# Patient Record
Sex: Male | Born: 1950 | Race: White | Hispanic: No | Marital: Married | Smoking: Former smoker
Health system: Southern US, Community
[De-identification: ages and names within clinical notes are randomized; demographics above are authoritative.]

## PROBLEM LIST (undated history)

## (undated) DIAGNOSIS — F419 Anxiety disorder, unspecified: Secondary | ICD-10-CM

## (undated) DIAGNOSIS — K760 Fatty (change of) liver, not elsewhere classified: Secondary | ICD-10-CM

## (undated) DIAGNOSIS — F329 Major depressive disorder, single episode, unspecified: Secondary | ICD-10-CM

## (undated) DIAGNOSIS — H9190 Unspecified hearing loss, unspecified ear: Secondary | ICD-10-CM

## (undated) DIAGNOSIS — G20 Parkinson's disease: Secondary | ICD-10-CM

## (undated) DIAGNOSIS — G20A1 Parkinson's disease without dyskinesia, without mention of fluctuations: Secondary | ICD-10-CM

## (undated) DIAGNOSIS — K219 Gastro-esophageal reflux disease without esophagitis: Secondary | ICD-10-CM

## (undated) DIAGNOSIS — E119 Type 2 diabetes mellitus without complications: Secondary | ICD-10-CM

## (undated) DIAGNOSIS — H269 Unspecified cataract: Secondary | ICD-10-CM

## (undated) DIAGNOSIS — R519 Headache, unspecified: Secondary | ICD-10-CM

## (undated) DIAGNOSIS — F32A Depression, unspecified: Secondary | ICD-10-CM

## (undated) DIAGNOSIS — N200 Calculus of kidney: Secondary | ICD-10-CM

## (undated) DIAGNOSIS — R42 Dizziness and giddiness: Secondary | ICD-10-CM

## (undated) DIAGNOSIS — Z974 Presence of external hearing-aid: Secondary | ICD-10-CM

## (undated) DIAGNOSIS — M1990 Unspecified osteoarthritis, unspecified site: Secondary | ICD-10-CM

## (undated) DIAGNOSIS — I10 Essential (primary) hypertension: Secondary | ICD-10-CM

## (undated) HISTORY — DX: Unspecified osteoarthritis, unspecified site: M19.90

## (undated) HISTORY — DX: Essential (primary) hypertension: I10

## (undated) HISTORY — DX: Depression, unspecified: F32.A

## (undated) HISTORY — DX: Type 2 diabetes mellitus without complications: E11.9

## (undated) HISTORY — DX: Calculus of kidney: N20.0

## (undated) HISTORY — DX: Major depressive disorder, single episode, unspecified: F32.9

## (undated) HISTORY — DX: Parkinson's disease without dyskinesia, without mention of fluctuations: G20.A1

## (undated) HISTORY — DX: Parkinson's disease: G20

## (undated) HISTORY — PX: HERNIA REPAIR: SHX51

## (undated) HISTORY — DX: Dizziness and giddiness: R42

## (undated) HISTORY — PX: JOINT REPLACEMENT: SHX530

---

## 2011-04-07 DIAGNOSIS — E349 Endocrine disorder, unspecified: Secondary | ICD-10-CM | POA: Insufficient documentation

## 2011-12-09 HISTORY — PX: KNEE ARTHROSCOPY: SUR90

## 2012-08-31 DIAGNOSIS — H9190 Unspecified hearing loss, unspecified ear: Secondary | ICD-10-CM | POA: Insufficient documentation

## 2013-09-06 DIAGNOSIS — M1990 Unspecified osteoarthritis, unspecified site: Secondary | ICD-10-CM | POA: Insufficient documentation

## 2014-04-13 DIAGNOSIS — D126 Benign neoplasm of colon, unspecified: Secondary | ICD-10-CM | POA: Insufficient documentation

## 2016-08-15 DIAGNOSIS — F411 Generalized anxiety disorder: Secondary | ICD-10-CM | POA: Insufficient documentation

## 2016-11-13 DIAGNOSIS — N200 Calculus of kidney: Secondary | ICD-10-CM | POA: Insufficient documentation

## 2017-02-11 DIAGNOSIS — E119 Type 2 diabetes mellitus without complications: Secondary | ICD-10-CM | POA: Insufficient documentation

## 2017-08-21 DIAGNOSIS — N132 Hydronephrosis with renal and ureteral calculous obstruction: Secondary | ICD-10-CM | POA: Insufficient documentation

## 2017-12-08 HISTORY — PX: SHOULDER ARTHROSCOPY: SHX128

## 2017-12-16 DIAGNOSIS — M19011 Primary osteoarthritis, right shoulder: Secondary | ICD-10-CM | POA: Insufficient documentation

## 2018-04-06 ENCOUNTER — Encounter: Payer: Self-pay | Admitting: Neurology

## 2018-04-13 NOTE — Progress Notes (Signed)
Bill Bowman was seen today in the movement disorders clinic for neurologic consultation at the request of Bill Haber, MD.  The consultation is for the evaluation of PD.  The records that were made available to me were reviewed.  This patient is accompanied in the office by his spouse who supplements the history. Patient first presented to Dr. Amalia Bowman in January, 2019 with complaints of left upper extremity tremor that had begun 2 years prior.  Primary care physician had given him propranolol, but that did not seem to help the tremor.  Dr. 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  An MRI of the brain was also ordered.  MRI of the brain was completed on February 03, 2018.  I do not have the films, only the report.  There is mild to moderate atrophy and mild cerebral small vessel disease.  There was also noted to be left sphenoid sinusitis.  Patient followed back up with Dr. Amalia Bowman about a month after his first visit and the patient did not feel that the levodopa was helping his tremor.  Therefore, medication was increased to carbidopa/levodopa 25/100, 2 tablets 3 times per day.  Patient still did not think that this was beneficial and he was therefore referred here.  Pt states that it has been 20% helpful.  Specific Symptoms:  Tremor: Yes.  , L hand x 2 years and L leg x 1 year Family hx of similar:  No. Voice: no change Sleep: on trazodone - 50 mg - for sleep x few months.  It has been useful  Vivid Dreams:  Yes.    Acting out dreams:  Doesn't sleep with wife; some mumbling per wife Wet Pillows: No. Postural symptoms:  Yes.    Falls?  No. - has had near falls per pt but 1 fall per wife (fell at [place] down stairs) Bradykinesia symptoms: shuffling gait, difficulty getting out of a chair and difficulty regaining balance Loss of smell:  No. Loss of taste:  No. Urinary Incontinence:  No. Difficulty  Swallowing:  No. Handwriting, micrographia: Yes.   Trouble with ADL's:  No.  Trouble buttoning clothing: No. Depression:  Yes.   but better with trazodone Memory changes:  Yes.   (does own meds without trouble) Hallucinations:  No.  visual distortions: No. N/V:  No. Lightheaded:  Yes.    Syncope: No. Diplopia:  No. but does have some intermittent blurry Dyskinesia:  No.  PREVIOUS MEDICATIONS: Sinemet  ALLERGIES:   Allergies  Allergen Reactions  . Clarithromycin Hypertension  . Hydrocodone-Homatropine Nausea And Vomiting  . Liraglutide Nausea And Vomiting    CURRENT MEDICATIONS:  No outpatient encounter medications on file as of 04/15/2018.   No facility-administered encounter medications on file as of 04/15/2018.     PAST MEDICAL HISTORY:   Past Medical History:  Diagnosis Date  . Arthritis   . Depression   . Diabetes (Bill Bowman)   . Hypertension   . Kidney stones   . Parkinson disease ([place])   . Vertigo     PAST SURGICAL HISTORY:   Past Surgical History:  Procedure Laterality Date  . KNEE ARTHROSCOPY Right 2013  . SHOULDER ARTHROSCOPY Left 12/2017    SOCIAL HISTORY:   Social History   Socioeconomic History  . Marital status: Married    Spouse name: Not on file  . Number of children: Not on file  . Years of education:  Not on file  . Highest education level: Not on file  Occupational History  . Not on file  Social Needs  . Financial resource strain: Not on file  . Food insecurity:    Worry: Not on file    Inability: Not on file  . Transportation needs:    Medical: Not on file    Non-medical: Not on file  Tobacco Use  . Smoking status: Former Smoker    Last attempt to quit: 04/15/1978    Years since quitting: 40.0  . Smokeless tobacco: Former Systems developer    Quit date: 04/15/1978  Substance and Sexual Activity  . Alcohol use: Yes    Comment: beer once a month  . Drug use: Never  . Sexual activity: Not on file  Lifestyle  . Physical activity:    Days per week:  Not on file    Minutes per session: Not on file  . Stress: Not on file  Relationships  . Social connections:    Talks on phone: Not on file    Gets together: Not on file    Attends religious service: Not on file    Active member of club or organization: Not on file    Attends meetings of clubs or organizations: Not on file    Relationship status: Not on file  . Intimate partner violence:    Fear of current or ex partner: Not on file    Emotionally abused: Not on file    Physically abused: Not on file    Forced sexual activity: Not on file  Other Topics Concern  . Not on file  Social History Narrative  . Not on file    FAMILY HISTORY:   Family Status  Relation Name Status  . Mother  Deceased  . Father  Deceased  . Sister x2 Alive  . Brother  Alive  . Child x2 Alive    ROS:  A complete 10 system review of systems was obtained and was unremarkable apart from what is mentioned above.  PHYSICAL EXAMINATION:    VITALS:   Vitals:   04/15/18 1342  BP: 112/72  Pulse: 88  SpO2: 97%  Weight: 214 lb (97.1 kg)  Height: 5\' 11"  (1.803 m)    GEN:  The patient appears stated age and is in NAD. HEENT:  Normocephalic, atraumatic.  The mucous membranes are moist. The superficial temporal arteries are without ropiness or tenderness. CV:  RRR Lungs:  CTAB Neck/HEME:  There are no carotid bruits bilaterally.  Neurological examination:  Orientation: The patient is alert and oriented x3. Fund of knowledge is appropriate.  Recent and remote memory are intact.  Attention and concentration are normal.    Able to name objects and repeat phrases. Cranial nerves: There is good facial symmetry. Pupils are equal round and reactive to light bilaterally. Fundoscopic exam reveals clear margins bilaterally. Extraocular muscles are intact. The visual fields are full to confrontational testing. The speech is fluent and clear. Soft palate rises symmetrically and there is no tongue deviation. Hearing  is intact to conversational tone. Sensation: Sensation is intact to light and pinprick throughout (facial, trunk, extremities). Vibration is intact at the bilateral big toe. There is no extinction with double simultaneous stimulation. There is no sensory dermatomal level identified. Motor: Strength is 5/5 in the bilateral upper and lower extremities.   Shoulder shrug is equal and symmetric.  There is no pronator drift. Deep tendon reflexes: Deep tendon reflexes are 2/4 at the bilateral biceps, triceps,  brachioradialis, patella and achilles. Plantar responses are downgoing bilaterally.  Movement examination: Tone: There is normal tone in the bilateral upper extremities.  The tone in the lower extremities is normal.  Abnormal movements: there is LUE resting tremor that increases with distraction Coordination:  There is minimal decremation with RAM's, only seen with  alternation of supination/pronation of the forearm bilaterally.  All other RAMs  including hand opening and closing, finger taps, heel taps and toe taps are normal. Gait and Station: The patient has no difficulty arising out of a deep-seated chair without the use of the hands. The patient's stride length is good with good arm swing and re-emergent tremor on the L.  The patient has a negative pull test.      ASSESSMENT/PLAN:  1.  Parkinsonian tremor  -Discussed today that the patient did not meet [place] brain bank criteria for the diagnosis, but he was on medication, which certainly could have been covering up bradykinesia and/or rigidity.  After quite some discussion, the patient ultimately decided to make appointment in the future and let me examine him off of medication.  -Discussed the concept of levodopa resistant tremor.  Discussed that this is not mean that levodopa does not work.  Discussed that this is the case in 40 to 50% of patients to take levodopa.  Discussed other methods of treating tremor.  Discussed trihexyphenidyl, which was  discussed with him by Dr. Amalia Bowman.  Discussed also that the dopamine agonist may have slightly better efficacy in terms of tremor control.  However, I also told him that I generally do not recommend changing medication just for tremor, as I often will find that the patient has side effects more than benefit.  Interestingly, although the patient originally stated that levodopa did not help much, he also stated that he did not want to go off of it because he found it beneficial.  For now, he will continue on carbidopa/levodopa 25/100, 2 tablets 3 times per day.  -Discussed the extreme importance of exercise.  Discussed studies on this.  Discussed the types of exercise as needed.  He is very resistant to this.  -Surgical intervention for Parkinson's disease was discussed, although I told him he certainly does not need that now.  He asked me many questions about this and I answered them to the best of my ability.  2.  I will plan on seeing the patient back one time, and then he can certainly return to care with Dr. Amalia Bowman since I am quite a distance to travel for the patient. Much greater than 50% of this visit was spent in counseling and coordinating care.  Total face to face time:  60 min  Thank you, Dr. Amalia Bowman, for allowing me to participate in the care of your patient.  Cc:  Ma Rings, MD

## 2018-04-15 ENCOUNTER — Encounter: Payer: Self-pay | Admitting: Neurology

## 2018-04-15 ENCOUNTER — Ambulatory Visit (INDEPENDENT_AMBULATORY_CARE_PROVIDER_SITE_OTHER): Payer: Medicare Other | Admitting: Neurology

## 2018-04-15 VITALS — BP 112/72 | HR 88 | Ht 71.0 in | Wt 214.0 lb

## 2018-04-15 DIAGNOSIS — G20 Parkinson's disease: Secondary | ICD-10-CM | POA: Diagnosis not present

## 2018-08-18 NOTE — Progress Notes (Signed)
Bill Bowman was seen today in the movement disorders clinic for neurologic consultation at the request of [name].  The consultation is for the evaluation of PD.  The records that were made available to me were reviewed.  This patient is accompanied in the office by his spouse who supplements the history. Patient first presented to [name] in January, 2019 with complaints of left upper extremity tremor that had begun 2 years prior.  Primary care physician had given him propranolol, but that did not seem to help the tremor.  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  An MRI of the brain was also ordered.  MRI of the brain was completed on February 03, 2018.  I do not have the films, only the report.  There is mild to moderate atrophy and mild cerebral small vessel disease.  There was also noted to be left sphenoid sinusitis.  Patient followed back up with [name] about a month after his first visit and the patient did not feel that the levodopa was helping his tremor.  Therefore, medication was increased to carbidopa/levodopa 25/100, 2 tablets 3 times per day.  Patient still did not think that this was beneficial and he was therefore referred here.  Pt states that it has been 20% helpful.  08/20/18 update: Patient is seen today in follow-up for tremor. This patient is accompanied in the office by his spouse who supplements the history. I asked the patient to hold his carbidopa/levodopa 25/100 prior to todays visit. He last took medication 4 days ago.  He doesn't note any change, nor does his wife.  He had 2 falls since last visit.  With one, he "threw a bag a corn on the truck and I went with it."  With the other, he was trying to get out of the bathtub at 2:30am and slipped.    "I don't want to be a [place] pig.  I just want you to fix my tremor."  Tremor is interfering with the things he likes to do.  He  finds himself frustrated because he cannot go fishing because he cannot hold the rod.  He also notes when he grabs a plate things will shake off of the plate.  ALLERGIES:   Allergies  Allergen Reactions  . Clarithromycin Hypertension  . Hydrocodone-Homatropine Nausea And Vomiting  . Liraglutide Nausea And Vomiting    CURRENT MEDICATIONS:  Outpatient Encounter Medications as of 08/20/2018  Medication Sig  . allopurinol (ZYLOPRIM) 100 MG tablet Take 100 mg by mouth daily.  Marland Kitchen aspirin 81 MG chewable tablet Chew 81 mg by mouth daily.  . Exenatide ER (BYDUREON BCISE) 2 MG/0.85ML AUIJ once a week.  . Insulin Glargine (TOUJEO SOLOSTAR) 300 UNIT/ML SOPN Inject 56 Units into the skin daily.  Marland Kitchen ketorolac (TORADOL) 10 MG tablet Take 10 mg by mouth every 6 (six) hours as needed.  . meclizine (ANTIVERT) 25 MG tablet Take 25 mg by mouth as needed.  . meloxicam (MOBIC) 15 MG tablet Take 15 mg by mouth as needed.  . metFORMIN (GLUCOPHAGE) 500 MG tablet Take 1,000 mg by mouth 2 (two) times daily.  Marland Kitchen oxyCODONE (OXY IR/ROXICODONE) 5 MG immediate release tablet Take 5 mg by mouth as needed.  Marland Kitchen PARoxetine (PAXIL) 30 MG tablet Take 30 mg by mouth daily.  . ramipril (ALTACE) 10 MG capsule Take 10 mg by mouth daily.  Marland Kitchen  traZODone (DESYREL) 50 MG tablet Take 50 mg by mouth at bedtime.  . carbidopa-levodopa (SINEMET) 25-100 MG tablet Take 2 tablets by mouth 3 (three) times daily.   No facility-administered encounter medications on file as of 08/20/2018.     PAST MEDICAL HISTORY:   Past Medical History:  Diagnosis Date  . Arthritis   . Depression   . Diabetes ([place])   . Hypertension   . Kidney stones   . Parkinson disease ([place])   . Vertigo     PAST SURGICAL HISTORY:   Past Surgical History:  Procedure Laterality Date  . KNEE ARTHROSCOPY Right 2013  . SHOULDER ARTHROSCOPY Left 12/2017    SOCIAL HISTORY:   Social History   Socioeconomic History  . Marital status: Married    Spouse name: Not on  file  . Number of children: Not on file  . Years of education: Not on file  . Highest education level: Not on file  Occupational History  . 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 Financial resource strain: Not on file  . Food insecurity:    Worry: Not on file    Inability: Not on file  . Transportation needs:    Medical: Not on file    Non-medical: Not on file  Tobacco Use  . Smoking status: Former Smoker    Last attempt to quit: 04/15/1978    Years since quitting: 40.3  . Smokeless tobacco: Former Systems developer    Quit date: 04/15/1978  Substance and Sexual Activity  . Alcohol use: Yes    Comment: beer once a month  . Drug use: Never  . Sexual activity: Not on file  Lifestyle  . Physical activity:    Days per week: Not on file    Minutes per session: Not on file  . Stress: Not on file  Relationships  . Social connections:    Talks on phone: Not on file    Gets together: Not on file    Attends religious service: Not on file    Active member of club or organization: Not on file    Attends meetings of clubs or organizations: Not on file    Relationship status: Not on file  . Intimate partner violence:    Fear of current or ex partner: Not on file    Emotionally abused: Not on file    Physically abused: Not on file    Forced sexual activity: Not on file  Other Topics Concern  . Not on file  Social History Narrative  . Not on file    FAMILY HISTORY:   Family Status  Relation Name Status  . Mother  Deceased  . Father  Deceased  . Sister x2 Alive  . Brother  Alive  . Child x2 Alive    ROS:  Review of Systems  Constitutional: Negative.   HENT: Negative.   Eyes: Negative.   Cardiovascular: Negative.   Gastrointestinal: Negative.   Genitourinary: Negative.   Skin: Negative.   Neurological: Positive for tremors.  Endo/Heme/Allergies: Negative.   Psychiatric/Behavioral: Positive for memory loss.     PHYSICAL EXAMINATION:     VITALS:   Vitals:   08/20/18 1311  BP: (!) 142/84  Pulse: 86  SpO2: 93%  Weight: 212 lb (96.2 kg)  Height: 5\' 10"  (1.778 m)    GEN:  The patient appears stated age and is in NAD. HEENT:  Normocephalic, atraumatic.  The mucous membranes are moist.  The superficial temporal arteries are without ropiness or tenderness. CV:  RRR Lungs:  CTAB Neck/HEME:  There are no carotid bruits bilaterally.  Neurological examination:  Orientation: The patient is alert and oriented x3. Cranial nerves: There is good facial symmetry. The speech is fluent and clear. Soft palate rises symmetrically and there is no tongue deviation. Hearing is intact to conversational tone. Sensation: Sensation is intact to light touch throughout Motor: Strength is 5/5 in the bilateral upper and lower extremities.   Shoulder shrug is equal and symmetric.  There is no pronator drift.   Movement examination: Tone: There is mild increased tone in the LUE with distraction Abnormal movements: there is near constant, mod amplitude LUE resting tremor.  There is L foot tremor as well.   Coordination:  There is decremation (mild to mod) with any form of RAMS, including alternating supination and pronation of the forearm, hand opening and closing, finger taps, heel taps and toe taps bilaterally, L more than right Gait and Station: The patient has no difficulty arising out of a deep-seated chair without the use of the hands. The patient's stride length is good with good arm swing and re-emergent tremor on the L.  The patient has a negative pull test.      ASSESSMENT/PLAN:  1.  Tremor predominant Parkinson's disease  -Long discussion with the patient today that he has levodopa resistant tremor.  Discussed exactly what that means.  He is more bradykinetic today off of medication.  He has mild rigidity off of medication.  Discussed that this does not mean he is a levodopa failure, only that it does not work well for tremor in him.   This is true in 50% of patients.  The patient expresses in very clear terms that the tremor is interfering with his life and he wants to do something about that.  He very much wants surgical intervention.  I talked to him about surgical intervention in early disease.  Discussed early STIM study from [place].  This did show benefit to early DBS, but I did tell him that risks of doing early DBS, especially if he had an atypical state (I do not think he does).  Also discussed that generally we do bilateral DBS, but he is symptoms are predominantly on the left side because he is early on in disease.  He therefore would have one-sided surgery and would need to come back in the future to have the other side.  He stated that he was fine with that, but just wanted to have surgery.  I talked to him about the fact that it just is not that easy.  I would like him to try pramipexole first.  After some discussion, he was agreeable.  He will start pramipexole and work up to 0.5 mg 3 times per day.  Risks, benefits, side effects and alternative therapies were discussed.  The opportunity to ask questions was given and they were answered to the best of my ability.  The patient expressed understanding and willingness to follow the outlined treatment protocols.  -He will continue carbidopa/levodopa 25/100, 2 tablets 3 times per day.  -Wife complains about some memory change and discussed with the patient that all patients need to undergo neurocognitive testing prior to DBS surgery.  We will schedule him with Dr. Si Raider  2.  Much greater than 50% of this visit was spent in counseling and coordinating care.  Total face to face time:  40 min  Cc:  Ma Rings,  MD

## 2018-08-20 ENCOUNTER — Encounter: Payer: Self-pay | Admitting: Neurology

## 2018-08-20 ENCOUNTER — Ambulatory Visit (INDEPENDENT_AMBULATORY_CARE_PROVIDER_SITE_OTHER): Payer: Medicare Other | Admitting: Neurology

## 2018-08-20 VITALS — BP 142/84 | HR 86 | Ht 70.0 in | Wt 212.0 lb

## 2018-08-20 DIAGNOSIS — G20 Parkinson's disease: Secondary | ICD-10-CM | POA: Diagnosis not present

## 2018-08-20 DIAGNOSIS — R413 Other amnesia: Secondary | ICD-10-CM | POA: Diagnosis not present

## 2018-08-20 MED ORDER — PRAMIPEXOLE DIHYDROCHLORIDE 0.5 MG PO TABS: 0.5000 mg | ORAL_TABLET | Freq: Three times a day (TID) | ORAL | 5 refills | Status: DC

## 2018-08-20 MED ORDER — PRAMIPEXOLE DIHYDROCHLORIDE 0.125 MG PO TABS: ORAL_TABLET | ORAL | 0 refills | Status: DC

## 2018-08-20 NOTE — Patient Instructions (Signed)
1. Restart Carbidopa Levodopa.  2. Start mirapex (pramipexole) as follows:  0.125 mg - 1 tablet three times per day for a week, then 2 tablets three times per day for a week and then fill the 0.5 mg tablet and take that, 1 pill three times per day  3. You have been referred for a neurocognitive evaluation in our office.   The evaluation has two parts.   . The first part of the evaluation is a clinical interview with the neuropsychologist (Dr. Macarthur Critchley). Please bring someone with you to this appointment if possible, as it is helpful for Dr. Si Raider to hear from both you and another adult who knows you well.   . The second part of the evaluation is testing with Dr. Marcia Brash technician Hinton Dyer). The testing includes a variety of tasks- mostly question-and-answer, some paper-and-pencil. There is nothing you need to do to prepare for this appointment, but having a good night's sleep prior to the testing, and bringing eyeglasses and hearing aids (if you wear them), is advised.   Please note: We have to reserve several hours of the neuropsychologist's time and the psychometrician's time for your evaluation appointment. As such, please note that there is a No-Show fee of $100. If you are unable to attend any of your appointments, please contact our office as soon as possible to reschedule.  4. We have you scheduled for your on/off test on 01/07/19 at 2:30 pm. Please do not take your Parkinson's medications on this date. Take your last dose on 24 hours prior.

## 2018-09-06 ENCOUNTER — Other Ambulatory Visit: Payer: Self-pay | Admitting: Neurology

## 2018-10-20 ENCOUNTER — Telehealth: Payer: Self-pay | Admitting: Neurology

## 2018-10-20 NOTE — Telephone Encounter (Signed)
Dr Bailar is leaving our office to take a job closer to where she lives. We have mailed a letter to patient to let her know we have canceled all appointments with Bailar. Thank you for your understanding °

## 2018-10-28 ENCOUNTER — Telehealth: Payer: Self-pay | Admitting: Neurology

## 2018-10-28 NOTE — Telephone Encounter (Signed)
Called patient's wife. They are okay with referral to [facility] in [place], since this is close to [place]. We will send referral.

## 2018-10-28 NOTE — Telephone Encounter (Signed)
Patient's wife is calling in asking about where they can get another neuro testing referral sent to. They live in [place] and just got the letter about his cancelled Bailar appts. She said [name] was the referring provider. Please call her back at [phone]. Thanks!

## 2018-10-28 NOTE — Telephone Encounter (Signed)
Referral faxed to [facility] to [phone] with confirmation received.

## 2018-11-08 ENCOUNTER — Telehealth: Payer: Self-pay | Admitting: Neurology

## 2018-11-08 NOTE — Telephone Encounter (Signed)
Patient's wife is calling in wanting a referral sent to the [facility] office since the one in [place] does not take her husband's insurance. Please send this in. If you need to call her it's [phone]. Thanks!

## 2018-11-08 NOTE — Telephone Encounter (Signed)
Will send referral to [facility]. I have tried and fax machine is not working. Will document once fax goes through to their clinic.

## 2018-11-11 NOTE — Telephone Encounter (Signed)
Referral faxed and confirmation from office that it was received.

## 2018-11-12 NOTE — Telephone Encounter (Signed)
Patient is scheduled with Dr. Darol Destine for clinical interview: 12/21/17 at 11:00 am, Neuropsychological testing 12/21/17 at 1:00 pm, and feedback results on 01/04/18 at 1:00 pm.

## 2019-01-07 ENCOUNTER — Ambulatory Visit: Payer: Medicare Other | Admitting: Neurology

## 2019-01-11 NOTE — Progress Notes (Signed)
Bill Bowman was seen today in the movement disorders clinic for neurologic consultation at the request of [name].  The consultation is for the evaluation of PD.  The records that were made available to me were reviewed.  This patient is accompanied in the office by his spouse who supplements the history. Patient first presented to [name] in January, 2019 with complaints of left upper extremity tremor that had begun 2 years prior.  Primary care physician had given him propranolol, but that did not seem to help the tremor.  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  An MRI of the brain was also ordered.  MRI of the brain was completed on February 03, 2018.  I do not have the films, only the report.  There is mild to moderate atrophy and mild cerebral small vessel disease.  There was also noted to be left sphenoid sinusitis.  Patient followed back up with [name] about a month after his first visit and the patient did not feel that the levodopa was helping his tremor.  Therefore, medication was increased to carbidopa/levodopa 25/100, 2 tablets 3 times per day.  Patient still did not think that this was beneficial and he was therefore referred here.  Pt states that it has been 20% helpful.  08/20/18 update: Patient is seen today in follow-up for tremor. This patient is accompanied in the office by his spouse who supplements the history. I asked the patient to hold his carbidopa/levodopa 25/100 prior to todays visit. He last took medication 4 days ago.  He doesn't note any change, nor does his wife.  He had 2 falls since last visit.  With one, he "threw a bag a corn on the truck and I went with it."  With the other, he was trying to get out of the bathtub at 2:30am and slipped.    "I don't want to be a [place] pig.  I just want you to fix my tremor."  Tremor is interfering with the things he likes to do.  He  finds himself frustrated because he cannot go fishing because he cannot hold the rod.  He also notes when he grabs a plate things will shake off of the plate.  01/12/19 update: Patient is seen today in follow-up for levodopa challenge, accompanied by his wife who supplements the history.  I started the patient on pramipexole last visit, 0.5 mg 3 times per day.  He has had no compulsive behaviors.  No sleep attacks.  He is not sure that the pramipexole helped the tremor.  He has had a few falls since last visit.  Some of those were when turning.  He is still on carbidopa/levodopa 25/100, 2 tablets 3 times per day.  Patient had neurocognitive testing done on December 21, 2018 at [facility] neuropsychology with Dr. Darol Destine.  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  It was felt that depression was not functionally interfering, but that should have become so, he could benefit from supportive psychotherapy.  He is already on Paxil.  ALLERGIES:   Allergies  Allergen Reactions  . Clarithromycin Hypertension  . Hydrocodone-Homatropine Nausea And Vomiting  . Liraglutide Nausea And Vomiting    CURRENT MEDICATIONS:  Outpatient Encounter Medications as of 01/12/2019  Medication Sig  . allopurinol (ZYLOPRIM) 100 MG tablet Take 100 mg by mouth daily.  . carbidopa-levodopa (SINEMET) 25-100 MG  tablet Take 2 tablets by mouth 3 (three) times daily.  . Exenatide ER (BYDUREON BCISE) 2 MG/0.85ML AUIJ once a week.  Marland Kitchen ketorolac (TORADOL) 10 MG tablet Take 10 mg by mouth every 6 (six) hours as needed.  . meclizine (ANTIVERT) 25 MG tablet Take 25 mg by mouth as needed.  . meloxicam (MOBIC) 15 MG tablet Take 15 mg by mouth as needed.  . metFORMIN (GLUCOPHAGE) 500 MG tablet Take 1,000 mg by mouth 2 (two) times daily.  Marland Kitchen oxyCODONE (OXY IR/ROXICODONE) 5 MG immediate release tablet Take 5 mg by mouth as needed.  Marland Kitchen PARoxetine (PAXIL) 30 MG tablet Take 30 mg by mouth daily.  .  pramipexole (MIRAPEX) 0.5 MG tablet Take 1 tablet (0.5 mg total) by mouth 3 (three) times daily.  . ramipril (ALTACE) 10 MG capsule Take 10 mg by mouth daily.  . traZODone (DESYREL) 50 MG tablet Take 50 mg by mouth at bedtime.  . Insulin Glargine (TOUJEO SOLOSTAR) 300 UNIT/ML SOPN Inject 56 Units into the skin daily.  . [DISCONTINUED] pramipexole (MIRAPEX) 0.5 MG tablet 0.5 mg three x a day   No facility-administered encounter medications on file as of 01/12/2019.     PAST MEDICAL HISTORY:   Past Medical History:  Diagnosis Date  . Arthritis   . Depression   . Diabetes ([place])   . Hypertension   . Kidney stones   . Parkinson disease ([place])   . Vertigo     PAST SURGICAL HISTORY:   Past Surgical History:  Procedure Laterality Date  . KNEE ARTHROSCOPY Right 2013  . SHOULDER ARTHROSCOPY Left 12/2017    SOCIAL HISTORY:   Social History   Socioeconomic History  . Marital status: Married    Spouse name: Not on file  . Number of children: Not on file  . Years of education: Not on file  . Highest education level: Not on file  Occupational History  . 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 Financial resource strain: Not on file  . Food insecurity:    Worry: Not on file    Inability: Not on file  . Transportation needs:    Medical: Not on file    Non-medical: Not on file  Tobacco Use  . Smoking status: Former Smoker    Last attempt to quit: 04/15/1978    Years since quitting: 40.7  . Smokeless tobacco: Former Systems developer    Quit date: 04/15/1978  Substance and Sexual Activity  . Alcohol use: Yes    Comment: beer once a month  . Drug use: Never  . Sexual activity: Not on file  Lifestyle  . Physical activity:    Days per week: Not on file    Minutes per session: Not on file  . Stress: Not on file  Relationships  . Social connections:    Talks on phone: Not on file    Gets together: Not on file    Attends religious service: Not on file      Active member of club or organization: Not on file    Attends meetings of clubs or organizations: Not on file    Relationship status: Not on file  . Intimate partner violence:    Fear of current or ex partner: Not on file    Emotionally abused: Not on file    Physically abused: Not on file    Forced sexual activity: Not on file  Other Topics Concern  . Not  on file  Social History Narrative  . Not on file    FAMILY HISTORY:   Family Status  Relation Name Status  . Mother  Deceased  . Father  Deceased  . Sister x2 Alive  . Brother  Alive  . Child x2 Alive    ROS:  Review of Systems  Constitutional: Negative.   HENT: Negative.   Eyes: Negative.   Respiratory: Negative.   Cardiovascular: Negative.   Musculoskeletal: Positive for falls.  Skin: Negative.   Endo/Heme/Allergies: Negative.      PHYSICAL EXAMINATION:    VITALS:   Vitals:   01/12/19 1004  BP: 112/76  Pulse: 90  SpO2: 93%  Weight: 221 lb (100.2 kg)  Height: 5\' 10"  (1.778 m)    GEN:  The patient appears stated age and is in NAD. HEENT:  Normocephalic, atraumatic.  The mucous membranes are moist. The superficial temporal arteries are without ropiness or tenderness. CV:  RRR Lungs:  CTAB Neck/HEME:  There are no carotid bruits bilaterally.  Neurological examination:  Orientation: The patient is alert and oriented x3. Cranial nerves: There is good facial symmetry. The speech is fluent and clear. Soft palate rises symmetrically and there is no tongue deviation. Hearing is intact to conversational tone. Sensation: Sensation is intact to light touch throughout Motor: Strength is 5/5 in the bilateral upper and lower extremities.   Shoulder shrug is equal and symmetric.  There is no pronator drift.   Movement examination: Tone: There is normal tone in the upper and lower extremities. Abnormal movements: At times, there is no tremor at all in the left upper extremity.  When distracted, he does have a  left upper extremity resting tremor.  I saw no tremor in the left leg. Coordination:  There is decremation (mild) with any form of RAMS, including alternating supination and pronation of the forearm, hand opening and closing, finger taps, heel taps and toe taps bilaterally, L more than right Gait and Station: The patient has no difficulty arising out of a deep-seated chair without the use of the hands. The patient's stride length is good with good arm swing and no re-emergent tremor today.  ASSESSMENT/PLAN:  1.  Tremor predominant Parkinson's disease  -I actually think that the pramipexole has helped tremor some, but he is not convinced.  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  The patient is very focused on the fact that he wants surgical intervention.  He was scheduled for a levodopa challenge today, but unfortunately he took medication before arriving today (and the levodopa we had here was expired, so we would not have been able to do the challenge test anyway).  We will reschedule that.  -He will continue with pramipexole, 0.5 mg 3 times per day.  -He will continue carbidopa/levodopa 25/100, 2 tablets 3 times per day.  -Patient had neurocognitive testing in January, 2020 and there was evidence of mild cognitive impairment, but no evidence of dementia.  There was evidence of mild depression, but not thought to be functionally significant.  He is already on Paxil.  -I talked to the patient about the logistics associated with DBS therapy.  I talked to the patient about risks/benefits/side effects of DBS therapy.  We talked about risks which included but were not limited to infection, paralysis, intraoperative seizure, death, stroke, bleeding around the electrode.   I talked to patient about fiducial placement 1 week prior to DBS therapy.  I talked to the patient about what to expect in the operating room, including the fact that this is an  awake surgery.  We talked about battery placement as well as which is done under general anesthesia, generally approximately one week following the initial surgery.  We also talked about the fact that the patient will need to be off of medications for surgery.  The patient and family were given the opportunity to ask questions, which they did, and I answered them to the best of my ability today.  We talked extensively about the fact that this is very early to have surgery.  We talked that his would be a unilateral surgery because of this.  He would need surgery likely later on in life to have the other side done.  We talked about early [place] in [place]  2.    Much greater than 50% of this visit was spent in counseling and coordinating care.  Total face to face time:  45 min  Cc:  Ma Rings, MD

## 2019-01-12 ENCOUNTER — Ambulatory Visit (INDEPENDENT_AMBULATORY_CARE_PROVIDER_SITE_OTHER): Payer: Medicare Other | Admitting: Neurology

## 2019-01-12 ENCOUNTER — Encounter: Payer: Self-pay | Admitting: Neurology

## 2019-01-12 VITALS — BP 112/76 | HR 90 | Ht 70.0 in | Wt 221.0 lb

## 2019-01-12 DIAGNOSIS — G20 Parkinson's disease: Secondary | ICD-10-CM

## 2019-02-01 NOTE — Progress Notes (Signed)
Bill Bowman was seen today in the movement disorders clinic for neurologic consultation at the request of [name].  The consultation is for the evaluation of PD.  The records that were made available to me were reviewed.  This patient is accompanied in the office by his spouse who supplements the history. Patient first presented to [name] in January, 2019 with complaints of left upper extremity tremor that had begun 2 years prior.  Primary care physician had given him propranolol, but that did not seem to help the tremor.  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  An MRI of the brain was also ordered.  MRI of the brain was completed on February 03, 2018.  I do not have the films, only the report.  There is mild to moderate atrophy and mild cerebral small vessel disease.  There was also noted to be left sphenoid sinusitis.  Patient followed back up with [name] about a month after his first visit and the patient did not feel that the levodopa was helping his tremor.  Therefore, medication was increased to carbidopa/levodopa 25/100, 2 tablets 3 times per day.  Patient still did not think that this was beneficial and he was therefore referred here.  Pt states that it has been 20% helpful.  08/20/18 update: Patient is seen today in follow-up for tremor. This patient is accompanied in the office by his spouse who supplements the history. I asked the patient to hold his carbidopa/levodopa 25/100 prior to todays visit. He last took medication 4 days ago.  He doesn't note any change, nor does his wife.  He had 2 falls since last visit.  With one, he "threw a bag a corn on the truck and I went with it."  With the other, he was trying to get out of the bathtub at 2:30am and slipped.    "I don't want to be a [place] pig.  I just want you to fix my tremor."  Tremor is interfering with the things he likes to do.  He  finds himself frustrated because he cannot go fishing because he cannot hold the rod.  He also notes when he grabs a plate things will shake off of the plate.  01/12/19 update: Patient is seen today in follow-up for levodopa challenge, accompanied by his wife who supplements the history.  I started the patient on pramipexole last visit, 0.5 mg 3 times per day.  He has had no compulsive behaviors.  No sleep attacks.  He is not sure that the pramipexole helped the tremor.  He has had a few falls since last visit.  Some of those were when turning.  He is still on carbidopa/levodopa 25/100, 2 tablets 3 times per day.  Patient had neurocognitive testing done on December 21, 2018 at [facility] neuropsychology with Dr. Darol Destine.  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  It was felt that depression was not functionally interfering, but that should have become so, he could benefit from supportive psychotherapy.  He is already on Paxil.  02/01/19 update:  Patient seen today for levodopa challenge, accompanied by his wife who supplements the history.  Patient is on pramipexole 0.5 mg 3 times per day.  No compulsive behaviors.  No sleep attacks.  He is on carbidopa/levodopa 25/100, 2 tablets 3 times per day.  He has had no falls since our last  visit.  Pt is off of medication today and last took medication Monday night (currently Wednesday at 10:20am)  ALLERGIES:   Allergies  Allergen Reactions  . Clarithromycin Hypertension  . Hydrocodone-Homatropine Nausea And Vomiting  . Liraglutide Nausea And Vomiting    CURRENT MEDICATIONS:  Outpatient Encounter Medications as of 02/02/2019  Medication Sig  . allopurinol (ZYLOPRIM) 100 MG tablet Take 100 mg by mouth daily.  . Exenatide ER (BYDUREON BCISE) 2 MG/0.85ML AUIJ once a week.  . Insulin Glargine (TOUJEO SOLOSTAR) 300 UNIT/ML SOPN Inject 56 Units into the skin daily.  Marland Kitchen ketorolac (TORADOL) 10 MG tablet Take 10 mg by mouth  every 6 (six) hours as needed.  . meclizine (ANTIVERT) 25 MG tablet Take 25 mg by mouth as needed.  . meloxicam (MOBIC) 15 MG tablet Take 15 mg by mouth as needed.  . metFORMIN (GLUCOPHAGE) 500 MG tablet Take 1,000 mg by mouth 2 (two) times daily.  Marland Kitchen oxyCODONE (OXY IR/ROXICODONE) 5 MG immediate release tablet Take 5 mg by mouth as needed.  Marland Kitchen PARoxetine (PAXIL) 30 MG tablet Take 30 mg by mouth daily.  . ramipril (ALTACE) 10 MG capsule Take 10 mg by mouth daily.  . traZODone (DESYREL) 50 MG tablet Take 50 mg by mouth at bedtime.  . carbidopa-levodopa (SINEMET) 25-100 MG tablet Take 2 tablets by mouth 3 (three) times daily.  . pramipexole (MIRAPEX) 0.5 MG tablet Take 1 tablet (0.5 mg total) by mouth 3 (three) times daily. (Patient not taking: Reported on 02/02/2019)  . [EXPIRED] carbidopa-levodopa (SINEMET IR) 25-100 MG per tablet immediate release 3 tablet    No facility-administered encounter medications on file as of 02/02/2019.     PAST MEDICAL HISTORY:   Past Medical History:  Diagnosis Date  . Arthritis   . Depression   . Diabetes ([place])   . Hypertension   . Kidney stones   . Parkinson disease ([place])   . Vertigo     PAST SURGICAL HISTORY:   Past Surgical History:  Procedure Laterality Date  . KNEE ARTHROSCOPY Right 2013  . SHOULDER ARTHROSCOPY Left 12/2017    SOCIAL HISTORY:   Social History   Socioeconomic History  . Marital status: Married    Spouse name: Not on file  . Number of children: Not on file  . Years of education: Not on file  . Highest education level: Not on file  Occupational History  . 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 Financial resource strain: Not on file  . Food insecurity:    Worry: Not on file    Inability: Not on file  . Transportation needs:    Medical: Not on file    Non-medical: Not on file  Tobacco Use  . Smoking status: Former Smoker    Last attempt to quit: 04/15/1978    Years since  quitting: 40.8  . Smokeless tobacco: Former Systems developer    Quit date: 04/15/1978  Substance and Sexual Activity  . Alcohol use: Yes    Comment: beer once a month  . Drug use: Never  . Sexual activity: Not on file  Lifestyle  . Physical activity:    Days per week: Not on file    Minutes per session: Not on file  . Stress: Not on file  Relationships  . Social connections:    Talks on phone: Not on file    Gets together: Not on file    Attends religious service: Not on  file    Active member of club or organization: Not on file    Attends meetings of clubs or organizations: Not on file    Relationship status: Not on file  . Intimate partner violence:    Fear of current or ex partner: Not on file    Emotionally abused: Not on file    Physically abused: Not on file    Forced sexual activity: Not on file  Other Topics Concern  . Not on file  Social History Narrative  . Not on file    FAMILY HISTORY:   Family Status  Relation Name Status  . Mother  Deceased  . Father  Deceased  . Sister x2 Alive  . Brother  Alive  . Child x2 Alive    ROS:  Review of Systems  Constitutional: Negative.   HENT: Negative.   Eyes: Negative.   Respiratory: Negative.   Cardiovascular: Negative.   Gastrointestinal: Negative.   Skin: Negative.      PHYSICAL EXAMINATION:    VITALS:   Vitals:   02/02/19 1022  BP: 124/84  Pulse: 92  SpO2: 91%  Weight: 220 lb (99.8 kg)  Height: 5\' 10"  (1.778 m)    GEN:  The patient appears stated age and is in NAD. HEENT:  Normocephalic, atraumatic.  The mucous membranes are moist. The superficial temporal arteries are without ropiness or tenderness. CV:  RRR Lungs:  CTAB Neck/HEME:  There are no carotid bruits bilaterally.  Neurological examination:  Orientation: The patient is alert and oriented x3. Cranial nerves: There is good facial symmetry. The speech is fluent and clear. Soft palate rises symmetrically and there is no tongue deviation. Hearing is  intact to conversational tone. Sensation: Sensation is intact to light touch throughout Motor: Strength is 5/5 in the bilateral upper and lower extremities.   Shoulder shrug is equal and symmetric.  There is no pronator drift.  Levodopa challenge done today.  UPDRS motor off score was 14.  Pt then given 300mg  of levodopa dissolved in ginger ale and waited 45 minutes to re-examine him.  UPDRS motor on score was 8.  Details of UPDRS motor score documented on separate neurophysiologic worksheet.    Movement examination: Tone: There is normal tone in the upper and lower extremities. Abnormal movements: There is left upper extremity resting tremor.  This is somewhat better after the addition of levodopa.  There is mild tremor in the left foot. Coordination:  There is decremation (mild) with finger taps on the left. Gait and Station: The patient has no difficulty arising out of a deep-seated chair without the use of the hands.  There is re-emergent tremor with ambulation.  He has a negative pull test.  ASSESSMENT/PLAN:  1.  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  Levodopa challenge test was done today.  -He will continue with pramipexole, 0.5 mg 3 times per day.  -He will continue carbidopa/levodopa 25/100, 2 tablets 3 times per day.  -Patient had neurocognitive testing in January, 2020 and there was evidence of mild cognitive impairment, but no evidence of dementia.  There was evidence of mild depression, but not thought to be functionally significant.  He is already on Paxil.  -I talked to the patient about the logistics associated with DBS therapy.  I talked to the patient about risks/benefits/side effects of DBS therapy.  We talked  about risks which included but were not limited to infection, paralysis, intraoperative seizure, death, stroke, bleeding around the  electrode.   I talked to patient about fiducial placement 1 week prior to DBS therapy.  I talked to the patient about what to expect in the operating room, including the fact that this is an awake surgery.  We talked about battery placement as well as which is done under general anesthesia, generally approximately one week following the initial surgery.  We also talked about the fact that the patient will need to be off of medications for surgery.  The patient and family were given the opportunity to ask questions, which they did, and I answered them to the best of my ability today.  Discussed early STIM results, as he is wanting DBS fairly early on.  He understands the potential risks of this.  He understands that this would be a unilateral surgery in his case, which means we would need to go back later on to do the other side.  He also understands that it is difficult to control Parkinson's with unilateral surgery, especially as time wears on.  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  He expresses desire to have the [place].  He will be a right STN implant.   2.    I will plan on seeing the patient back in the late summer.  I will also make a referral to Dr. Vertell Limber in the summer.  Much greater than 50% of this visit was spent in counseling and coordinating care.  Total face to face time:  60 min, not including wait time for the carbidopa/levodopa 25/100 to kick in.  Cc:  Ma Rings, MD

## 2019-02-02 ENCOUNTER — Encounter: Payer: Self-pay | Admitting: Neurology

## 2019-02-02 ENCOUNTER — Ambulatory Visit (INDEPENDENT_AMBULATORY_CARE_PROVIDER_SITE_OTHER): Payer: Medicare Other | Admitting: Neurology

## 2019-02-02 VITALS — BP 124/84 | HR 92 | Ht 70.0 in | Wt 220.0 lb

## 2019-02-02 DIAGNOSIS — G20 Parkinson's disease: Secondary | ICD-10-CM

## 2019-02-02 MED ORDER — CARBIDOPA-LEVODOPA 25-100 MG PO TABS
3.0000 | ORAL_TABLET | Freq: Once | ORAL | Status: AC
  Administered 2019-02-02: 3 via ORAL

## 2019-02-09 ENCOUNTER — Telehealth: Payer: Self-pay | Admitting: Neurology

## 2019-02-09 NOTE — Telephone Encounter (Signed)
Referral faxed to [facility] at [phone] with confirmation received. Request was for appt in June/July. They will contact the patient to schedule.

## 2019-02-10 NOTE — Telephone Encounter (Signed)
Received note from [facility] that patient is scheduled to see Dr. Vertell Limber 05/23/2019.

## 2019-02-28 ENCOUNTER — Encounter: Payer: Medicare Other | Admitting: Psychology

## 2019-05-04 DIAGNOSIS — Z6833 Body mass index (BMI) 33.0-33.9, adult: Secondary | ICD-10-CM | POA: Insufficient documentation

## 2019-05-04 DIAGNOSIS — E6609 Other obesity due to excess calories: Secondary | ICD-10-CM | POA: Insufficient documentation

## 2019-05-27 ENCOUNTER — Other Ambulatory Visit: Payer: Self-pay | Admitting: Neurology

## 2019-05-27 NOTE — Telephone Encounter (Signed)
Requested Prescriptions   Pending Prescriptions Disp Refills  . pramipexole (MIRAPEX) 0.5 MG tablet [Pharmacy Med Name: PRAMIPEXOLE 0.5MG  TABLETS] 270 tablet     Sig: TAKE 1 TABLET BY MOUTH 3 TIMES DAILY   Rx last filled: 08/20/18 #90 5 refills  Pt last seen:02/02/19  Follow up appt scheduled:08/03/19

## 2019-06-14 ENCOUNTER — Other Ambulatory Visit: Payer: Self-pay | Admitting: Neurosurgery

## 2019-08-01 NOTE — Progress Notes (Signed)
Virtual Visit via Telephone Note The purpose of this virtual visit is to provide medical care while limiting exposure to the novel coronavirus.    Consent was obtained for phone visit:  Yes.   Answered questions that patient had about telehealth interaction:  Yes.   I discussed the limitations, risks, security and privacy concerns of performing an evaluation and management service by telephone. I also discussed with the patient that there may be a patient responsible charge related to this service. The patient expressed understanding and agreed to proceed.  Pt location: Home Physician Location: home Name of referring provider:  Ma Rings, MD I connected with .Bill Bowman at patients initiation/request on 08/03/2019 at 11:15 AM EDT by telephone and verified that I am speaking with the correct person using two identifiers.  Pt MRN:  [id number] Pt DOB:  12-03-1951   Current Outpatient Medications:  .  carbidopa-levodopa (SINEMET) 25-100 MG tablet, Take 2 tablets by mouth 3 (three) times daily., Disp: , Rfl:  .  Exenatide ER (BYDUREON BCISE) 2 MG/0.85ML AUIJ, once a week., Disp: , Rfl:  .  meloxicam (MOBIC) 15 MG tablet, Take 15 mg by mouth as needed., Disp: , Rfl:  .  metFORMIN (GLUCOPHAGE) 500 MG tablet, Take 1,000 mg by mouth 2 (two) times daily., Disp: , Rfl:  .  oxyCODONE (OXY IR/ROXICODONE) 5 MG immediate release tablet, Take 5 mg by mouth as needed., Disp: , Rfl:  .  PARoxetine (PAXIL) 30 MG tablet, Take 30 mg by mouth daily., Disp: , Rfl:  .  pramipexole (MIRAPEX) 0.5 MG tablet, TAKE 1 TABLET BY MOUTH 3 TIMES DAILY, Disp: 270 tablet, Rfl: 1 .  ramipril (ALTACE) 10 MG capsule, Take 10 mg by mouth daily., Disp: , Rfl:  .  traZODone (DESYREL) 50 MG tablet, Take 50 mg by mouth at bedtime., Disp: , Rfl:  .  Insulin Glargine (TOUJEO SOLOSTAR) 300 UNIT/ML SOPN, Inject 56 Units into the skin daily., Disp: , Rfl:    History of Present Illness:  Patient seen today in  follow-up for Parkinson's disease.  Patient's wife supplements the history.  He is currently on pramipexole 0.5 mg 3 times per day.  He is also on carbidopa/levodopa 25/100, 2 tablets 3 times per day.  He has wanted to pursue DBS surgery, and is currently scheduled for January, 2021 (his request).  Medical records from Dr. Vertell Limber are reviewed.  Patient saw Dr. Vertell Limber on May 23, 2019.  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  Patient has had 3 falls since our last visit.  Several of those have been related to dizziness.  With one other is currently been already got up and was really dizzy and just fell over.  Wife states that with 1 of them, he turned around and just fell.  Patient has had no loss of consciousness.  He is on Altace.  He has not checked his blood pressure.  He does have diabetes.   Observations/Objective:     Vitals:   08/03/19 1020  Weight: 217 lb (98.4 kg)  Height: 5\' 10"  (1.778 m)     Assessment and Plan:   1.  Parkinson's disease, diagnosed January, 2019 with symptoms 2 years prior.  Patient with levodopa resistant tremor.    -Levodopa challenge test has already been completed.  -Patient will continue pramipexole 0.5 mg 3 times per day.  -Continue carbidopa/levodopa 25/100, 2 tablets 3 times per day.  -Neurocognitive  testing was completed on December 21, 2018 at [facility] neuropsychology.  No evidence of dementia.  There was evidence of mild cognitive impairment and mild depression.  This was not felt to be functionally interfering.  Patient is on Paxil.  -I talked to the patient about the logistics associated with DBS therapy.  I talked to the patient about risks/benefits/side effects of DBS therapy.  We talked about risks which included but were not limited to infection, paralysis, intraoperative seizure, death, stroke, bleeding around the electrode.   I talked to patient about fiducial  placement 1 week prior to DBS therapy.  I talked to the patient about what to expect in the operating room, including the fact that this is an awake surgery.  We talked about battery placement as well as which is done under general anesthesia, generally approximately one week following the initial surgery.  We also talked about the fact that the patient will need to be off of medications for surgery.  The patient and family were given the opportunity to ask questions, which they did, and I answered them to the best of my ability today.  His will be a right STN implant.  He would like a Product/process development scientist.  -Understands that right now, this would be a unilateral implant.  Understands that we would need to go back in for the other side at some point.  Understands that, if new medical issues arise, this may prevent him from being a surgery candidate on the other side, which would make treatment difficult.  -We will do preop MRI.  He is claustrophobic and asked for prescription for Valium for this.  He will not drive to the MRI.  -Discussed preop video.  He will be off of his medications for this.  -Postoperative appointments were made.  2.  Dizziness  -His stepdaughter is a Marine scientist.  Asked him to have her checked his blood pressure, or to get an inexpensive blood pressure cuff at the pharmacy.  He is on blood pressure medication, although that may be for renal protection, given that he is diabetic.  He is to check his blood pressure in various positions, including the standing position, as that is when he is getting lightheaded.  Perhaps, if it is a blood pressure issue, the Altace can just be lowered in dosage.  He will let me know the blood pressures once he checks them.  Follow Up Instructions:    -I discussed the assessment and treatment plan with the patient. The patient was provided an opportunity to ask questions and all were answered. The patient agreed with the plan and demonstrated an  understanding of the instructions.   The patient was advised to call back or seek an in-person evaluation if the symptoms worsen or if the condition fails to improve as anticipated.    Total Time spent in visit with the patient was:  25 min, of which 100% of the time was spent in counseling and/or coordinating care, as above.   Pt understands and agrees with the plan of care outlined.     Alonza Bogus, DO

## 2019-08-03 ENCOUNTER — Telehealth (INDEPENDENT_AMBULATORY_CARE_PROVIDER_SITE_OTHER): Payer: Medicare Other | Admitting: Neurology

## 2019-08-03 ENCOUNTER — Other Ambulatory Visit: Payer: Self-pay

## 2019-08-03 ENCOUNTER — Encounter: Payer: Self-pay | Admitting: Neurology

## 2019-08-03 ENCOUNTER — Telehealth: Payer: Self-pay

## 2019-08-03 VITALS — Ht 70.0 in | Wt 217.0 lb

## 2019-08-03 DIAGNOSIS — G20 Parkinson's disease: Secondary | ICD-10-CM

## 2019-08-03 DIAGNOSIS — R42 Dizziness and giddiness: Secondary | ICD-10-CM | POA: Diagnosis not present

## 2019-08-03 NOTE — Telephone Encounter (Signed)
Order placed Rx also placed

## 2019-08-03 NOTE — Telephone Encounter (Signed)
-----   Message from [place], DO sent at 08/03/2019 11:35 AM EDT ----- Schedule pt at [facility] for pre-op DBS MRI brain with and without gad.  Needs valium #3 per my protocol.  Dx:  PD

## 2019-08-05 MED ORDER — DIAZEPAM 5 MG PO TABS: ORAL_TABLET | ORAL | 0 refills | Status: DC

## 2019-09-07 ENCOUNTER — Telehealth: Payer: Self-pay | Admitting: Neurology

## 2019-09-07 NOTE — Telephone Encounter (Signed)
Patient's wife said that she hasn't heard from MRI scheduling and was wanting to see what was going on with that please. Thanks!

## 2019-09-07 NOTE — Telephone Encounter (Signed)
Pt was schedule at [facility] with wife she was given number to call to schedule.

## 2019-09-08 NOTE — Patient Instructions (Signed)
1.  On 12/13/2019, decrease pramipexole to 0.5 mg twice per day. 2.  On 12/16/2019, decrease pramipexole to 0.5 mg once per day 3.  On 12/18/2019, STOP pramipexole 4.  You can take your carbidopa/levodopa 25/100 as normal until 24 hours prior to your procedure (last dose the evening of 12/18/2019).

## 2019-09-08 NOTE — Progress Notes (Signed)
Bill Bowman was seen today in the movement disorders clinic for neurologic consultation at the request of [name].  The consultation is for the evaluation of PD.  The records that were made available to me were reviewed.  This patient is accompanied in the office by his spouse who supplements the history. Patient first presented to [name] in January, 2019 with complaints of left upper extremity tremor that had begun 2 years prior.  Primary care physician had given him propranolol, but that did not seem to help the tremor.  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  An MRI of the brain was also ordered.  MRI of the brain was completed on February 03, 2018.  I do not have the films, only the report.  There is mild to moderate atrophy and mild cerebral small vessel disease.  There was also noted to be left sphenoid sinusitis.  Patient followed back up with [name] about a month after his first visit and the patient did not feel that the levodopa was helping his tremor.  Therefore, medication was increased to carbidopa/levodopa 25/100, 2 tablets 3 times per day.  Patient still did not think that this was beneficial and he was therefore referred here.  Pt states that it has been 20% helpful.  08/20/18 update: Patient is seen today in follow-up for tremor. This patient is accompanied in the office by his spouse who supplements the history. I asked the patient to hold his carbidopa/levodopa 25/100 prior to todays visit. He last took medication 4 days ago.  He doesn't note any change, nor does his wife.  He had 2 falls since last visit.  With one, he "threw a bag a corn on the truck and I went with it."  With the other, he was trying to get out of the bathtub at 2:30am and slipped.    "I don't want to be a [place] pig.  I just want you to fix my tremor."  Tremor is interfering with the things he likes to do.  He  finds himself frustrated because he cannot go fishing because he cannot hold the rod.  He also notes when he grabs a plate things will shake off of the plate.  01/12/19 update: Patient is seen today in follow-up for levodopa challenge, accompanied by his wife who supplements the history.  I started the patient on pramipexole last visit, 0.5 mg 3 times per day.  He has had no compulsive behaviors.  No sleep attacks.  He is not sure that the pramipexole helped the tremor.  He has had a few falls since last visit.  Some of those were when turning.  He is still on carbidopa/levodopa 25/100, 2 tablets 3 times per day.  Patient had neurocognitive testing done on December 21, 2018 at [facility] neuropsychology with Dr. Darol Destine.  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  It was felt that depression was not functionally interfering, but that should have become so, he could benefit from supportive psychotherapy.  He is already on Paxil.  02/01/19 update:  Patient seen today for levodopa challenge, accompanied by his wife who supplements the history.  Patient is on pramipexole 0.5 mg 3 times per day.  No compulsive behaviors.  No sleep attacks.  He is on carbidopa/levodopa 25/100, 2 tablets 3 times per day.  He has had no falls since our last  visit.  Pt is off of medication today and last took medication Monday night (currently Wednesday at 10:20am)  09/09/2019 update: Patient seen today in follow-up for preop video for surgery for Parkinson's disease.  Patient is currently on pramipexole 0.5 mg 3 times per day.  He is also on carbidopa/levodopa 25/100, 2 tablets 3 times per day.  "My arm has shook so bad its sore."  "the medication works more than I thought."  Patient did hold those medications for today's visit.  Patient is scheduled for surgery on December 20, 2019 and battery replacement on December 29, 2019.  4 falls since last visit - all in the turns.  Asks about increasing the  trazodone.  Trouble swallowing dry things like bread.  ALLERGIES:   Allergies  Allergen Reactions   Clarithromycin Hypertension   Hydrocodone-Homatropine Nausea And Vomiting   Liraglutide Nausea And Vomiting    CURRENT MEDICATIONS:  Outpatient Encounter Medications as of 09/09/2019  Medication Sig   carbidopa-levodopa (SINEMET) 25-100 MG tablet Take 2 tablets by mouth 3 (three) times daily.   Exenatide ER (BYDUREON BCISE) 2 MG/0.85ML AUIJ once a week.   meloxicam (MOBIC) 15 MG tablet Take 15 mg by mouth as needed.   metFORMIN (GLUCOPHAGE) 500 MG tablet Take 1,000 mg by mouth 2 (two) times daily.   oxyCODONE (OXY IR/ROXICODONE) 5 MG immediate release tablet Take 5 mg by mouth as needed.   PARoxetine (PAXIL) 30 MG tablet Take 30 mg by mouth daily.   pramipexole (MIRAPEX) 0.5 MG tablet TAKE 1 TABLET BY MOUTH 3 TIMES DAILY   ramipril (ALTACE) 10 MG capsule Take 10 mg by mouth daily.   traZODone (DESYREL) 50 MG tablet Take 50 mg by mouth at bedtime.   Insulin Glargine (TOUJEO SOLOSTAR) 300 UNIT/ML SOPN Inject 56 Units into the skin daily.   [DISCONTINUED] diazepam (VALIUM) 5 MG tablet 1 tablet 1 hour prior to procedure; may repeat 15 min prior   No facility-administered encounter medications on file as of 09/09/2019.     PAST MEDICAL HISTORY:   Past Medical History:  Diagnosis Date   Arthritis    Depression    Diabetes ([place])    Hypertension    Kidney stones    Parkinson disease ([place])    Vertigo     PAST SURGICAL HISTORY:   Past Surgical History:  Procedure Laterality Date   KNEE ARTHROSCOPY Right 2013   SHOULDER ARTHROSCOPY Left 12/2017    SOCIAL HISTORY:   Social History   Socioeconomic History   Marital status: Married    Spouse name: Not on file   Number of children: 2   Years of education: Not on file   Highest education level: High school graduate  Occupational History   Occupation: retired    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 strain: Not on file   Food insecurity    Worry: Not on file    Inability: Not on file   Transportation needs    Medical: Not on file    Non-medical: Not on file  Tobacco Use   Smoking status: Former Smoker    Quit date: 04/15/1978    Years since quitting: 41.4   Smokeless tobacco: Former Systems developer    Quit date: 04/15/1978  Substance and Sexual Activity   Alcohol use: Yes    Comment: beer once a month   Drug use: Never   Sexual activity: Not Currently    Partners: Female  Lifestyle  Physical activity    Days per week: Not on file    Minutes per session: Not on file   Stress: Not on file  Relationships   Social connections    Talks on phone: Not on file    Gets together: Not on file    Attends religious service: Not on file    Active member of club or organization: Not on file    Attends meetings of clubs or organizations: Not on file    Relationship status: Not on file   Intimate partner violence    Fear of current or ex partner: Not on file    Emotionally abused: Not on file    Physically abused: Not on file    Forced sexual activity: Not on file  Other Topics Concern   Not on file  Social History Narrative   Not on file    FAMILY HISTORY:   Family Status  Relation Name Status   Mother  Deceased   Father  Deceased   Sister x2 79   Brother  Alive   Child x2 Alive    ROS:  Review of Systems  Constitutional: Negative.   HENT:       Swallowing trouble  Eyes: Negative.   Cardiovascular: Negative.   Gastrointestinal: Negative.   Musculoskeletal: Positive for falls.  Skin: Negative.   Neurological: Positive for tremors.     PHYSICAL EXAMINATION:    VITALS:   Vitals:   09/09/19 0835  BP: 130/89  Pulse: 97  SpO2: 99%  Weight: 223 lb (101.2 kg)  Height: 5\' 10"  (1.778 m)    GEN:  The patient appears stated age and is in NAD. HEENT:  Normocephalic, atraumatic.  The mucous membranes are  moist. The superficial temporal arteries are without ropiness or tenderness. CV:  RRR Lungs:  CTAB Neck/HEME:  There are no carotid bruits bilaterally.  Neurological examination:  Orientation: The patient is alert and oriented x3. Cranial nerves: There is good facial symmetry. The speech is fluent and clear. Soft palate rises symmetrically and there is no tongue deviation. Hearing is intact to conversational tone. Sensation: Sensation is intact to light touch throughout Motor: Strength is 5/5 in the bilateral upper and lower extremities.   Shoulder shrug is equal and symmetric.  There is no pronator drift.   Movement examination: Tone: There is normal tone in the upper and lower extremities. Abnormal movements: There is left upper extremity resting tremor and LLE resting tremor Coordination:  There is decremation (mild) with finger taps on the left. Gait and Station: The patient has no difficulty arising out of a deep-seated chair without the use of the hands.  There is re-emergent tremor with ambulation.  He has trouble in the turns.    ASSESSMENT/PLAN:  1.  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  Patient does not disagree after he was off of medication today.  -He will continue with pramipexole, 0.5 mg 3 times per day.  -He will continue carbidopa/levodopa 25/100, 2 tablets 3 times per day.  -Patient had neurocognitive testing in January, 2020 and there was evidence of mild cognitive impairment, but no evidence of dementia.  There was evidence of mild depression, but not thought to be functionally significant.  He is already on Paxil.  -pts MRI brain scheduled for September 22, 2019.  Patient given Valium to help keep  him sedated in the MRI to avoid any movement artifact.  He will also take his Parkinson's medication prior to the scan.  -I talked to  the patient about the logistics associated with DBS therapy.  I talked to the patient about risks/benefits/side effects of DBS therapy.  We talked about risks which included but were not limited to infection, paralysis, intraoperative seizure, death, stroke, bleeding around the electrode.   I talked to patient about fiducial placement 1 week prior to DBS therapy.  I talked to the patient about what to expect in the operating room, including the fact that this is an awake surgery.  We talked about battery placement as well as which is done under general anesthesia, generally approximately one week following the initial surgery.  We also talked about the fact that the patient will need to be off of medications for surgery.  The patient and family were given the opportunity to ask questions, which they did, and I answered them to the best of my ability today.  Discussed early STIM results, as he is wanting DBS fairly early on.  He understands the potential risks of this.  He understands that this would be a unilateral surgery in his case, which means we would need to go back later on to do the other side.  He also understands that it is difficult to control Parkinson's with unilateral surgery, especially as time wears on.  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  He expresses desire to have the [place].  He will be a right STN implant.  Surgery is scheduled for January 12.  -Preop video was done today.  -Weaning schedule for medication was given today.   2.    Insomnia  -he is on trazodone.  Asks about increasing that.  I have no objection but told him to ask prescribing pcp.    3.  Dysphagia  -refuses MBE  4.  The next time that I will see him will be at surgery, in January.  He was encouraged to call me with any questions or concerns in the meantime.  Much greater than 50% of this  visit was spent in counseling and coordinating care.  Total face to face time:  25 min  Cc:  Ma Rings, MD

## 2019-09-09 ENCOUNTER — Other Ambulatory Visit: Payer: Self-pay

## 2019-09-09 ENCOUNTER — Encounter: Payer: Self-pay | Admitting: Neurology

## 2019-09-09 ENCOUNTER — Ambulatory Visit (INDEPENDENT_AMBULATORY_CARE_PROVIDER_SITE_OTHER): Payer: Medicare Other | Admitting: Neurology

## 2019-09-09 VITALS — BP 130/89 | HR 97 | Ht 70.0 in | Wt 223.0 lb

## 2019-09-09 DIAGNOSIS — R1319 Other dysphagia: Secondary | ICD-10-CM

## 2019-09-09 DIAGNOSIS — G4700 Insomnia, unspecified: Secondary | ICD-10-CM | POA: Diagnosis not present

## 2019-09-09 DIAGNOSIS — G20 Parkinson's disease: Secondary | ICD-10-CM | POA: Diagnosis not present

## 2019-09-22 ENCOUNTER — Other Ambulatory Visit: Payer: Self-pay

## 2019-09-22 ENCOUNTER — Ambulatory Visit (HOSPITAL_COMMUNITY)
Admission: RE | Admit: 2019-09-22 | Discharge: 2019-09-22 | Disposition: A | Discharge status: Home / Self Care | Payer: Medicare Other | Source: Ambulatory Visit | Attending: Neurology | Admitting: Neurology

## 2019-09-22 DIAGNOSIS — G20 Parkinson's disease: Secondary | ICD-10-CM

## 2019-09-22 LAB — CREATININE, SERUM
Creatinine, Ser: 0.95 mg/dL (ref 0.61–1.24)
GFR calc Af Amer: >60 mL/min (ref >60)
GFR calc non Af Amer: >60 mL/min (ref >60)

## 2019-09-22 MED ORDER — GADOBUTROL 1 MMOL/ML IV SOLN
10.0000 mL | Freq: Once | INTRAVENOUS | Status: AC | PRN
  Administered 2019-09-22: 15:00:00 10 mL via INTRAVENOUS

## 2019-09-23 ENCOUNTER — Telehealth: Payer: Self-pay

## 2019-09-23 NOTE — Telephone Encounter (Signed)
-----   Message from [place], DO sent at 09/23/2019  7:46 AM EDT ----- Let pt know that I reviewed his MRI brain and it looks good for DBS planning

## 2019-09-23 NOTE — Telephone Encounter (Signed)
Pt spouse called back she was informed of results.

## 2019-10-20 ENCOUNTER — Other Ambulatory Visit: Payer: Self-pay

## 2019-10-20 ENCOUNTER — Telehealth: Payer: Self-pay | Admitting: Neurology

## 2019-10-20 MED ORDER — CARBIDOPA-LEVODOPA 25-100 MG PO TABS: 2.0000 | ORAL_TABLET | Freq: Three times a day (TID) | ORAL | 1 refills | Status: DC

## 2019-10-20 NOTE — Telephone Encounter (Signed)
Patient is needing a refill on the sinemet medication to the pharm on file. Pharm wont fill because he no longer see's [name]. She asked to please send in a refill of the medication to the pharm on file for 60 days supply if possible. 2 tablets by mouth 3 xs daily (25-100mg ). Thanks!

## 2019-10-20 NOTE — Telephone Encounter (Signed)
Sent!

## 2019-10-21 ENCOUNTER — Other Ambulatory Visit: Payer: Self-pay

## 2019-10-21 MED ORDER — CARBIDOPA-LEVODOPA 25-100 MG PO TABS: 2.0000 | ORAL_TABLET | Freq: Three times a day (TID) | ORAL | 0 refills | Status: DC

## 2019-10-21 NOTE — Telephone Encounter (Signed)
Adjusted and resent

## 2019-10-21 NOTE — Telephone Encounter (Signed)
Patient's wife Hilda Blades called back regarding patients refill. She said the pharmacy is needing to know if it's a 60 day supply or 30? She said he would like a 60 day supply. He uses [facility] in [place]. Thank you

## 2019-11-24 ENCOUNTER — Other Ambulatory Visit (HOSPITAL_COMMUNITY): Payer: Self-pay | Admitting: Neurosurgery

## 2019-11-24 ENCOUNTER — Other Ambulatory Visit: Payer: Self-pay | Admitting: Neurosurgery

## 2019-11-24 DIAGNOSIS — G20 Parkinson's disease: Secondary | ICD-10-CM

## 2019-12-12 ENCOUNTER — Telehealth: Payer: Self-pay | Admitting: Neurology

## 2019-12-12 NOTE — Telephone Encounter (Signed)
No answer at 117  

## 2019-12-12 NOTE — Telephone Encounter (Signed)
Returning your call please call back

## 2019-12-12 NOTE — Telephone Encounter (Signed)
Please call patient and just remind him that in preparation for upcoming surgery to start titration off of his medication.  He was given a titration schedule but surgery date was changed after that.  Have him do the following (and have him write this down since dates have changed)  1. On 12/18/2019, decrease pramipexole to 0.5 mg twice per day. 2. On 12/21/2019, decrease pramipexole to 0.5 mg once per day 3. On 12/25/2019, STOP pramipexole 4. You can take your carbidopa/levodopa 25/100 as normal until 24 hours prior to your procedure (last dose the evening of 12/27/2019).  Following the procedure, you can resume all meds as normal.    Loma Sousa, you can send this to him in my chart as well.

## 2019-12-13 ENCOUNTER — Ambulatory Visit (HOSPITAL_COMMUNITY): Payer: Medicare Other

## 2019-12-14 NOTE — Telephone Encounter (Signed)
Wife was already aware per mychart message she viewed. She has no questions

## 2019-12-14 NOTE — Telephone Encounter (Signed)
My chart message sent per Dr Tat request

## 2019-12-15 NOTE — H&P (View-Only) (Signed)
Patient ID:   [id number] Patient: Bill Bowman  Date of Birth: 26-Mar-1951 Visit Type: Office Visit   Date: 05/23/2019 11:30 AM Provider: Marchia Meiers. Vertell Limber MD   This 69 year old male presents for Tremors.  HISTORY OF PRESENT ILLNESS:  1.  Tremors  Dariusz Ketcham, 69 year old retired male, visits to discuss deep brain stimulation for Parkinson's disease.  Patient reports left arm and leg tremor increasing since 2018.  He visits on referral from Dr. Wells Guiles Tat, who recommends a [facility] Scientific right STN device. Patient reports that his tremor increases when he is engaging in tasks. Patient reports that his tremor increases with his quality of life. Patient reports he is unable to hold a plate in his left hand. Patient reports he sometimes has trouble with walking. Patient reports medication does not help the tremor, though it does improve the patient's stiffness.   History:  HTN, NIDDM, vertigo, depression, Parkinson's, cataracts Surgical history:  Right TKR 2014?, left shoulder replacement January 2019         Medical/Surgical/Interim History Reviewed, no change.     Family History:  Reviewed, no changes.    Social History: Reviewed, no changes.   MEDICATIONS: (added, continued or stopped this visit) Started Medication Directions Instruction Stopped   allopurinol 100 mg tablet take 1 tablet by oral route  every day     Altace 10 mg capsule take 1 capsule by oral route  every day     Bydureon BCise 2 mg/0.85 mL subcutaneous auto-injector inject (2MG )  by subcutaneous route  every 7 days in the abdomen, thighs, or outer area of upper arm rotating injectionsites     carbidopa 25 mg-levodopa 100 mg tablet take 1 tablet by oral route 3 times every day     ketorolac 10 mg tablet take 1 tablet by oral route  every 4 hours as needed for up to 5 days total use     meclizine 25 mg tablet take 1 tablet by oral route 3 times every day as needed     meloxicam 15 mg tablet take 1  tablet by oral route  every day     metformin 500 mg tablet take 1 tablet by oral route 2 times every day with morning and evening meals     oxycodone 5 mg tablet take 1 tablet by oral route  every 4 - 6 hours as needed     Paxil 30 mg tablet take 1 tablet by oral route  every day     pramipexole 0.5 mg tablet take 1 tablet by oral route 3 times every day     Toujeo SoloStar U-300 Insulin 300 unit/mL (1.5 mL) subcutaneous pen inject 26 units by subcutaneous route  every day     trazodone 50 mg tablet take 1 tablet by oral route  every day after meals       ALLERGIES: Ingredient Reaction Medication Name Comment  HYDROCODONE     LIRAGLUTIDE     CLARITHROMYCIN     HOMATROPINE        REVIEW OF SYSTEMS   See scanned patient registration form, dated, signed and dated on   Review of Systems Details System Neg/Pos Details  Constitutional Negative Chills, Fatigue, Fever, Malaise, Night sweats, Weight gain and Weight loss.  ENMT Negative Ear drainage, Hearing loss, Nasal drainage, Otalgia, Sinus pressure and Sore throat.  Eyes Negative Eye discharge, Eye pain and Vision changes.  Respiratory Negative Chronic cough, Cough, Dyspnea, Known TB exposure and Wheezing.  Cardio  Negative Chest pain, Claudication, Edema and Irregular heartbeat/palpitations.  GI Negative Abdominal pain, Blood in stool, Change in stool pattern, Constipation, Decreased appetite, Diarrhea, Heartburn, Nausea and Vomiting.  GU Negative Dribbling, Dysuria, Erectile dysfunction, Hematuria, Polyuria (Genitourinary), Slow stream, Urinary frequency, Urinary incontinence and Urinary retention.  Endocrine Negative Cold intolerance, Heat intolerance, Polydipsia and Polyphagia.  Neuro Positive Tremors.  Psych Negative Anxiety, Depression and Insomnia.  Integumentary Negative Brittle hair, Brittle nails, Change in shape/size of mole(s), Hair loss, Hirsutism, Hives, Pruritus, Rash and Skin lesion.  MS Negative Back pain, Joint pain,  Joint swelling, Muscle weakness and Neck pain.  Hema/Lymph Negative Easy bleeding, Easy bruising and Lymphadenopathy.  Allergic/Immuno Negative Contact allergy, Environmental allergies, Food allergies and Seasonal allergies.  Reproductive Negative Penile discharge and Sexual dysfunction.   PHYSICAL EXAM:   Vitals Date Temp F BP Pulse Ht In Wt Lb BMI BSA Pain Score  05/23/2019 97.3 136/87 90 68 221 33.6  0/10    PHYSICAL EXAM Details General Level of Distress: no acute distress Overall Appearance: normal  Head and Face  Right Left  Fundoscopic Exam:  normal normal    Cardiovascular Cardiac: regular rate and rhythm without murmur  Right Left  Carotid Pulses: normal normal  Respiratory Lungs: clear to auscultation  Neurological Orientation: normal Recent and Remote Memory: normal Attention Span and Concentration:   normal Language: normal Fund of Knowledge: normal  Right Left Sensation: normal normal Upper Extremity Coordination: normal normal  Lower Extremity Coordination: normal normal  Musculoskeletal Gait and Station: normal  Right Left Upper Extremity Muscle Strength: normal normal Lower Extremity Muscle Strength: normal normal Upper Extremity Muscle Tone:  normal tremor Lower Extremity Muscle Tone: normal normal   Motor Strength Upper and lower extremity motor strength was tested in the clinically pertinent muscles.     Deep Tendon Reflexes  Right Left Biceps: normal normal Triceps: normal normal Brachioradialis: normal normal Patellar: normal normal Achilles: normal normal  Cranial Nerves II. Optic Nerve/Visual Fields: normal III. Oculomotor: normal IV. Trochlear: normal V. Trigeminal: normal VI. Abducens: normal VII. Facial: normal VIII. Acoustic/Vestibular: normal IX. Glossopharyngeal: normal X. Vagus: normal XI. Spinal Accessory: normal XII. Hypoglossal: normal  Motor and other  Tests Lhermittes: negative Rhomberg: negative Pronator drift: absent     Right Left Hoffman's: normal normal Clonus: normal normal Babinski: normal normal   Additional Findings:  Rest tremor left arm greater than leg with mild cogwheeling rigidity    IMPRESSION:   Discussed deep brain stimulator at length with patient and the surgery. Gave patient a pamphlet with additional information. Upon examination, when patient is distracted, his tremor increases. Patient has very little tremor on the right side. Patient would like to elect to have his surgery in January due to deer hunting season. Patient would like the battery on the left side of his chest, also due to deer hunting.    PLAN:  1) Right STN deep brain stimulator for Parkinson's disease  Orders: Instruction(s)/Education: Assessment Instruction  I10 Hypertension education  [phone] Lifestyle education regarding diet   Completed Orders (this encounter) Order Details Reason Side Interpretation Result Initial Treatment Date Region  Hypertension education Patient to follow up with primary care provider        Lifestyle education regarding diet Patient encouraged to eat a well balance diet         Assessment/Plan   # Detail Type Description   1. Assessment Essential (primary) hypertension (I10).       2. Assessment Body mass index (BMI)  33.0-33.9, adult [id number]).   Plan Orders Today's instructions / counseling include(s) Lifestyle education regarding diet. Clinical information/comments: Patient encouraged to eat a well balance diet.         Pain Management Plan Pain Scale: 0/10. Method: Numeric Pain Intensity Scale. Location: brain. Onset: 05/22/2017. Duration: varies. Quality: discomforting. Pain management follow-up plan of care: Patient taking medication as prescribed.  Fall Risk Plan The patient has fallen 1 times in the last year.  Falls risk follow-up plan of care: Assisted devices: Advise to use safety  measures when necessary.              Provider:  Marchia Meiers. Vertell Limber MD  05/23/2019 05:22 PM    Dictation edited by: Judd Gaudier    CC Providers: Alonza Bogus  [address] [place], [place] [address]               Electronically signed by Marchia Meiers. Vertell Limber MD on 05/23/2019 05:22 PM

## 2019-12-15 NOTE — H&P (Signed)
Patient ID:   [id number] Patient: Bill Bowman  Date of Birth: August 28, 1951 Visit Type: Office Visit   Date: 05/23/2019 11:30 AM Provider: Marchia Meiers. Vertell Limber MD   This 69 year old male presents for Tremors.  HISTORY OF PRESENT ILLNESS:  1.  Tremors  Bill Bowman, 69 year old retired male, visits to discuss deep brain stimulation for Parkinson's disease.  Patient reports left arm and leg tremor increasing since 2018.  He visits on referral from Dr. Wells Guiles Bowman, who recommends a [facility] [employer] right STN device. Patient reports that his tremor increases when he is engaging in tasks. Patient reports that his tremor increases with his quality of life. Patient reports he is unable to hold a plate in his left hand. Patient reports he sometimes has trouble with walking. Patient reports medication does not help the tremor, though it does improve the patient's stiffness.   History:  HTN, NIDDM, vertigo, depression, Parkinson's, cataracts Surgical history:  Right TKR 2014?, left shoulder replacement January 2019         Medical/Surgical/Interim History Reviewed, no change.     Family History:  Reviewed, no changes.    Social History: Reviewed, no changes.   MEDICATIONS: (added, continued or stopped this visit) Started Medication Directions Instruction Stopped   allopurinol 100 mg tablet take 1 tablet by oral route  every day     Altace 10 mg capsule take 1 capsule by oral route  every day     Bydureon BCise 2 mg/0.85 mL subcutaneous auto-injector inject (2MG )  by subcutaneous route  every 7 days in the abdomen, thighs, or outer area of upper arm rotating injectionsites     carbidopa 25 mg-levodopa 100 mg tablet take 1 tablet by oral route 3 times every day     ketorolac 10 mg tablet take 1 tablet by oral route  every 4 hours as needed for up to 5 days total use     meclizine 25 mg tablet take 1 tablet by oral route 3 times every day as needed     meloxicam 15 mg tablet take 1  tablet by oral route  every day     metformin 500 mg tablet take 1 tablet by oral route 2 times every day with morning and evening meals     oxycodone 5 mg tablet take 1 tablet by oral route  every 4 - 6 hours as needed     Paxil 30 mg tablet take 1 tablet by oral route  every day     pramipexole 0.5 mg tablet take 1 tablet by oral route 3 times every day     Toujeo SoloStar U-300 Insulin 300 unit/mL (1.5 mL) subcutaneous pen inject 26 units by subcutaneous route  every day     trazodone 50 mg tablet take 1 tablet by oral route  every day after meals       ALLERGIES: Ingredient Reaction Medication Name Comment  HYDROCODONE     LIRAGLUTIDE     CLARITHROMYCIN     HOMATROPINE        REVIEW OF SYSTEMS   See scanned patient registration form, dated, signed and dated on   Review of Systems Details System Neg/Pos Details  Constitutional Negative Chills, Fatigue, Fever, Malaise, Night sweats, Weight gain and Weight loss.  ENMT Negative Ear drainage, Hearing loss, Nasal drainage, Otalgia, Sinus pressure and Sore throat.  Eyes Negative Eye discharge, Eye pain and Vision changes.  Respiratory Negative Chronic cough, Cough, Dyspnea, Known TB exposure and Wheezing.  Cardio  Negative Chest pain, Claudication, Edema and Irregular heartbeat/palpitations.  GI Negative Abdominal pain, Blood in stool, Change in stool pattern, Constipation, Decreased appetite, Diarrhea, Heartburn, Nausea and Vomiting.  GU Negative Dribbling, Dysuria, Erectile dysfunction, Hematuria, Polyuria (Genitourinary), Slow stream, Urinary frequency, Urinary incontinence and Urinary retention.  Endocrine Negative Cold intolerance, Heat intolerance, Polydipsia and Polyphagia.  Neuro Positive Tremors.  Psych Negative Anxiety, Depression and Insomnia.  Integumentary Negative Brittle hair, Brittle nails, Change in shape/size of mole(s), Hair loss, Hirsutism, Hives, Pruritus, Rash and Skin lesion.  MS Negative Back pain, Joint pain,  Joint swelling, Muscle weakness and Neck pain.  Hema/Lymph Negative Easy bleeding, Easy bruising and Lymphadenopathy.  Allergic/Immuno Negative Contact allergy, Environmental allergies, Food allergies and Seasonal allergies.  Reproductive Negative Penile discharge and Sexual dysfunction.   PHYSICAL EXAM:   Vitals Date Temp F BP Pulse Ht In Wt Lb BMI BSA Pain Score  05/23/2019 97.3 136/87 90 68 221 33.6  0/10    PHYSICAL EXAM Details General Level of Distress: no acute distress Overall Appearance: normal  Head and Face  Right Left  Fundoscopic Exam:  normal normal    Cardiovascular Cardiac: regular rate and rhythm without murmur  Right Left  Carotid Pulses: normal normal  Respiratory Lungs: clear to auscultation  Neurological Orientation: normal Recent and Remote Memory: normal Attention Span and Concentration:   normal Language: normal Fund of Knowledge: normal  Right Left Sensation: normal normal Upper Extremity Coordination: normal normal  Lower Extremity Coordination: normal normal  Musculoskeletal Gait and Station: normal  Right Left Upper Extremity Muscle Strength: normal normal Lower Extremity Muscle Strength: normal normal Upper Extremity Muscle Tone:  normal tremor Lower Extremity Muscle Tone: normal normal   Motor Strength Upper and lower extremity motor strength was tested in the clinically pertinent muscles.     Deep Tendon Reflexes  Right Left Biceps: normal normal Triceps: normal normal Brachioradialis: normal normal Patellar: normal normal Achilles: normal normal  Cranial Nerves II. Optic Nerve/Visual Fields: normal III. Oculomotor: normal IV. Trochlear: normal V. Trigeminal: normal VI. Abducens: normal VII. Facial: normal VIII. Acoustic/Vestibular: normal IX. Glossopharyngeal: normal X. Vagus: normal XI. Spinal Accessory: normal XII. Hypoglossal: normal  Motor and other  Tests Lhermittes: negative Rhomberg: negative Pronator drift: absent     Right Left Hoffman's: normal normal Clonus: normal normal Babinski: normal normal   Additional Findings:  Rest tremor left arm greater than leg with mild cogwheeling rigidity    IMPRESSION:   Discussed deep brain stimulator at length with patient and the surgery. Gave patient a pamphlet with additional information. Upon examination, when patient is distracted, his tremor increases. Patient has very little tremor on the right side. Patient would like to elect to have his surgery in January due to deer hunting season. Patient would like the battery on the left side of his chest, also due to deer hunting.    PLAN:  1) Right STN deep brain stimulator for Parkinson's disease  Orders: Instruction(s)/Education: Assessment Instruction  I10 Hypertension education  [phone] Lifestyle education regarding diet   Completed Orders (this encounter) Order Details Reason Side Interpretation Result Initial Treatment Date Region  Hypertension education Patient to follow up with primary care provider        Lifestyle education regarding diet Patient encouraged to eat a well balance diet         Assessment/Plan   # Detail Type Description   1. Assessment Essential (primary) hypertension (I10).       2. Assessment Body mass index (BMI)  33.0-33.9, adult [id number]).   Plan Orders Today's instructions / counseling include(s) Lifestyle education regarding diet. Clinical information/comments: Patient encouraged to eat a well balance diet.         Pain Management Plan Pain Scale: 0/10. Method: Numeric Pain Intensity Scale. Location: brain. Onset: 05/22/2017. Duration: varies. Quality: discomforting. Pain management follow-up plan of care: Patient taking medication as prescribed.  Fall Risk Plan The patient has fallen 1 times in the last year.  Falls risk follow-up plan of care: Assisted devices: Advise to use safety  measures when necessary.              Provider:  Marchia Meiers. Vertell Limber MD  05/23/2019 05:22 PM    Dictation edited by: Judd Gaudier    CC Providers: Alonza Bogus  [address] [place], [place] [address]               Electronically signed by Marchia Meiers. Vertell Limber MD on 05/23/2019 05:22 PM

## 2019-12-20 ENCOUNTER — Ambulatory Visit (HOSPITAL_COMMUNITY)
Admission: RE | Admit: 2019-12-20 | Discharge: 2019-12-20 | Disposition: A | Discharge status: Home / Self Care | Payer: Medicare Other | Source: Ambulatory Visit | Attending: Neurosurgery | Admitting: Neurosurgery

## 2019-12-20 ENCOUNTER — Ambulatory Visit (HOSPITAL_COMMUNITY)
Admission: RE | Admit: 2019-12-20 | Discharge: 2019-12-20 | Disposition: A | Discharge status: Home / Self Care | Payer: Medicare Other | Attending: Neurosurgery | Admitting: Neurosurgery

## 2019-12-20 ENCOUNTER — Encounter (HOSPITAL_COMMUNITY)
Admission: RE | Disposition: A | Discharge status: Home / Self Care | Payer: Self-pay | Source: Home / Self Care | Attending: Neurosurgery

## 2019-12-20 ENCOUNTER — Other Ambulatory Visit: Payer: Self-pay

## 2019-12-20 ENCOUNTER — Encounter (HOSPITAL_COMMUNITY): Payer: Self-pay | Admitting: Neurosurgery

## 2019-12-20 DIAGNOSIS — E1136 Type 2 diabetes mellitus with diabetic cataract: Secondary | ICD-10-CM | POA: Diagnosis not present

## 2019-12-20 DIAGNOSIS — Z96612 Presence of left artificial shoulder joint: Secondary | ICD-10-CM | POA: Insufficient documentation

## 2019-12-20 DIAGNOSIS — Z794 Long term (current) use of insulin: Secondary | ICD-10-CM | POA: Insufficient documentation

## 2019-12-20 DIAGNOSIS — F329 Major depressive disorder, single episode, unspecified: Secondary | ICD-10-CM | POA: Insufficient documentation

## 2019-12-20 DIAGNOSIS — G20 Parkinson's disease: Secondary | ICD-10-CM | POA: Diagnosis not present

## 2019-12-20 DIAGNOSIS — I10 Essential (primary) hypertension: Secondary | ICD-10-CM | POA: Insufficient documentation

## 2019-12-20 DIAGNOSIS — R251 Tremor, unspecified: Secondary | ICD-10-CM | POA: Diagnosis present

## 2019-12-20 HISTORY — PX: MINOR PLACEMENT OF FIDUCIAL: SHX6748

## 2019-12-20 SURGERY — MINOR PLACEMENT OF FIDUCIAL
Anesthesia: LOCAL

## 2019-12-20 MED ORDER — OXYCODONE HCL 5 MG PO TABS: 5.0000 mg | ORAL_TABLET | Freq: Four times a day (QID) | ORAL | 0 refills | Status: DC | PRN

## 2019-12-20 MED ORDER — BACITRACIN-NEOMYCIN-POLYMYXIN OINTMENT TUBE
TOPICAL_OINTMENT | CUTANEOUS | Status: AC
  Filled 2019-12-20: qty 14.17

## 2019-12-20 MED ORDER — CEPHALEXIN 250 MG PO CAPS: 250.0000 mg | ORAL_CAPSULE | Freq: Four times a day (QID) | ORAL | 0 refills | Status: DC

## 2019-12-20 MED ORDER — LIDOCAINE-EPINEPHRINE 1 %-1:100000 IJ SOLN
INTRAMUSCULAR | Status: AC
  Filled 2019-12-20: qty 1

## 2019-12-20 SURGICAL SUPPLY — 19 items
BAG ATCL THK3 35X25 (MISCELLANEOUS) ×1 IMPLANT
BAG BIOHAZARD 25X35 (MISCELLANEOUS) ×1
BLADE CLIPPER SPEC (BLADE) ×2 IMPLANT
BLADE SURG 11 STRL SS (BLADE) ×2 IMPLANT
BNDG ADH 1X3 SHEER STRL LF (GAUZE/BANDAGES/DRESSINGS) ×10 IMPLANT
COVER BACK TABLE 60X90IN (DRAPES) ×2 IMPLANT
COVER WAND RF STERILE (DRAPES) ×2 IMPLANT
DRAPE HALF SHEET 40X57 (DRAPES) ×2 IMPLANT
DRAPE SHEET LG 3/4 BI-LAMINATE (DRAPES) ×2 IMPLANT
GAUZE SPONGE 4X4 12PLY STRL (GAUZE/BANDAGES/DRESSINGS) ×6 IMPLANT
GLOVE BIO SURGEON STRL SZ8 (GLOVE) ×4 IMPLANT
GLOVE ECLIPSE 8.5 STRL (GLOVE) ×2 IMPLANT
NEEDLE HYPO 18GX1.5 BLUNT FILL (NEEDLE) ×2 IMPLANT
NEEDLE HYPO 25X1 1.5 SAFETY (NEEDLE) ×2 IMPLANT
SOL PREP POV-IOD 4OZ 10% (MISCELLANEOUS) ×2 IMPLANT
STAPLER SKIN PROX WIDE 3.9 (STAPLE) ×2 IMPLANT
SUT ETHILON 3 0 PS 1 (SUTURE) ×10 IMPLANT
SYR CONTROL 10ML LL (SYRINGE) ×2 IMPLANT
TOWEL GREEN STERILE (TOWEL DISPOSABLE) ×2 IMPLANT

## 2019-12-20 NOTE — Op Note (Signed)
12/20/2019  8:48 AM  PATIENT:  Bill Bowman  69 y.o. male  PRE-OPERATIVE DIAGNOSIS:  Parkinson Disease  POST-OPERATIVE DIAGNOSIS:  Parkinson Disease  PROCEDURE:  Procedure(s) with comments: Fiducial placement (N/A) - Fiducial placement  SURGEON:  Surgeon(s) and Role:    Erline Levine, MD - Primary  PHYSICIAN ASSISTANT:   ASSISTANTS: Poteat, RN   ANESTHESIA:   local  EBL: Minimal  BLOOD ADMINISTERED:none  DRAINS: none   LOCAL MEDICATIONS USED:  XYLOCAINE    SPECIMEN:  No Specimen  DISPOSITION OF SPECIMEN:  N/A  COUNTS:  YES  TOURNIQUET:  * No tourniquets in log *  DICTATION: Indications:  Patient has essential tremor and presents for Star Fix Fiducial Placement for upcoming DBS STN placement.    Procedure:  Patient was brought to short stay room.  His scalp was shaved.  Areas of planned fiducial placement were marked, scalp was prepped with betadine.  Scalp was infiltrated with lidocaine with epinephrine.  Four fiducials were placed according to standard landmarks through stab incisions.  3-0 Nylon sutures were placed and sterile dressings were applied.  Patient tolerated procedure well.  He was taken to recovery.  PLAN OF CARE: Discharge home  PATIENT DISPOSITION:  Short Stay   Delay start of Pharmacological VTE agent (>24hrs) due to surgical blood loss or risk of bleeding: yes

## 2019-12-20 NOTE — Brief Op Note (Signed)
12/20/2019  8:48 AM  PATIENT:  Bill Bowman  69 y.o. male  PRE-OPERATIVE DIAGNOSIS:  Parkinson Disease  POST-OPERATIVE DIAGNOSIS:  Parkinson Disease  PROCEDURE:  Procedure(s) with comments: Fiducial placement (N/A) - Fiducial placement  SURGEON:  Surgeon(s) and Role:    Erline Levine, MD - Primary  PHYSICIAN ASSISTANT:   ASSISTANTS: Poteat, RN   ANESTHESIA:   local  EBL: Minimal  BLOOD ADMINISTERED:none  DRAINS: none   LOCAL MEDICATIONS USED:  XYLOCAINE    SPECIMEN:  No Specimen  DISPOSITION OF SPECIMEN:  N/A  COUNTS:  YES  TOURNIQUET:  * No tourniquets in log *  DICTATION: Indications:  Patient has essential tremor and presents for Star Fix Fiducial Placement for upcoming DBS STN placement.    Procedure:  Patient was brought to short stay room.  His scalp was shaved.  Areas of planned fiducial placement were marked, scalp was prepped with betadine.  Scalp was infiltrated with lidocaine with epinephrine.  Four fiducials were placed according to standard landmarks through stab incisions.  3-0 Nylon sutures were placed and sterile dressings were applied.  Patient tolerated procedure well.  He was taken to recovery.  PLAN OF CARE: Discharge home  PATIENT DISPOSITION:  Short Stay   Delay start of Pharmacological VTE agent (>24hrs) due to surgical blood loss or risk of bleeding: yes

## 2019-12-20 NOTE — Progress Notes (Signed)
Fudicial placement complete. Patient taken to radiology with Verdis Prime, RN.

## 2019-12-22 ENCOUNTER — Encounter: Payer: Self-pay | Admitting: *Deleted

## 2019-12-23 ENCOUNTER — Telehealth: Payer: Self-pay | Admitting: Neurology

## 2019-12-23 ENCOUNTER — Telehealth: Payer: Self-pay

## 2019-12-23 NOTE — Telephone Encounter (Signed)
Please call patient and make sure that he is weaning parkinsons meds for surgery.  We previously sent him a my chart message about it

## 2019-12-23 NOTE — Telephone Encounter (Signed)
Pt is weaning down his parkinsons medication he is down to the one a day,

## 2019-12-23 NOTE — Telephone Encounter (Signed)
Pt didn't answer phone voice mail left for him to call back to Make sure Pt has been winging off his medication, mychart message was sent 12/14/19 this is a follow up call,  Per Dr Tat:  1. On 12/18/2019, decrease pramipexole to 0.5 mg twice per day. 2. On 12/21/2019, decrease pramipexole to 0.5 mg once per day 3. On 12/25/2019, STOP pramipexole 4. You can take your carbidopa/levodopa 25/100 as normal until 24 hours prior to your procedure (last dose the evening of 12/27/2019).  Following the procedure, you can resume all meds as normal.

## 2019-12-26 ENCOUNTER — Other Ambulatory Visit (HOSPITAL_COMMUNITY)
Admission: RE | Admit: 2019-12-26 | Discharge: 2019-12-26 | Disposition: A | Discharge status: Home / Self Care | Payer: Medicare Other | Source: Ambulatory Visit | Attending: Neurosurgery | Admitting: Neurosurgery

## 2019-12-26 DIAGNOSIS — Z20822 Contact with and (suspected) exposure to covid-19: Secondary | ICD-10-CM | POA: Insufficient documentation

## 2019-12-26 DIAGNOSIS — Z01812 Encounter for preprocedural laboratory examination: Secondary | ICD-10-CM | POA: Insufficient documentation

## 2019-12-27 LAB — NOVEL CORONAVIRUS, NAA (HOSP ORDER, SEND-OUT TO REF LAB; TAT 18-24 HRS): SARS-CoV-2, NAA: NOT DETECTED

## 2019-12-28 ENCOUNTER — Encounter (HOSPITAL_COMMUNITY): Payer: Self-pay | Admitting: Neurosurgery

## 2019-12-28 ENCOUNTER — Other Ambulatory Visit: Payer: Self-pay

## 2019-12-28 NOTE — Progress Notes (Signed)
SDW-Pre-op call completed by pt spouse, Hilda Blades (DPR). Spouse denies that pt C/O SOB and chest pain. Spouse denies that pt is under the care of a cardiologist. Spouse stated that pt sees [name], Neurology and Dr. Gillie Manners, PCP. Spouse denies that pt had a cardiac cath. Spouse denies that pt had an EKG in the last year.  Spouse stated that pt was instructed to stop blood thinners. Spouse stated that pt last dose of Pramipexole was 1/17 and last dose of  carbidopa-levodopa (SINEMET)  was 12/27/19 at [facility] as instructed. Spouse made aware to have pt stop taking  vitamins, fish oil and herbal medications. Do not take any NSAIDs ie: Ibuprofen, Advil, Naproxen (Aleve), Motrin, Meloxicam ( Mobic) BC and Goody Powder. Spouse made aware to have pt take 50 % of Toujeo insulin tonight (26 units). Spouse made aware to have pt hold Metformin on DOS. Spouse made aware to have pt check CBG every 2 hours prior to arrival to hospital on DOS. Spouse made aware to have pt treat a CBG < 70 with 4 glucose tabs, wait 15 minutes after intervention to recheck CBG, if CBG remains < 70, call Short Stay unit to speak with a nurse. Spouse reminded to have pt quarantine. Spouse verbalized understanding of all pre-op instructions.

## 2019-12-29 ENCOUNTER — Other Ambulatory Visit: Payer: Self-pay

## 2019-12-29 ENCOUNTER — Inpatient Hospital Stay (HOSPITAL_COMMUNITY): Payer: Medicare Other | Admitting: Registered Nurse

## 2019-12-29 ENCOUNTER — Encounter (HOSPITAL_COMMUNITY): Payer: Self-pay | Admitting: Neurosurgery

## 2019-12-29 ENCOUNTER — Inpatient Hospital Stay (HOSPITAL_COMMUNITY)
Admission: RE | Admit: 2019-12-29 | Discharge: 2019-12-31 | DRG: 027 | Disposition: A | Discharge status: Home / Self Care | Payer: Medicare Other | Attending: Neurosurgery | Admitting: Neurosurgery

## 2019-12-29 ENCOUNTER — Encounter (HOSPITAL_COMMUNITY)
Admission: RE | Disposition: A | Discharge status: Home / Self Care | Payer: Self-pay | Source: Home / Self Care | Attending: Neurosurgery

## 2019-12-29 DIAGNOSIS — Z96612 Presence of left artificial shoulder joint: Secondary | ICD-10-CM | POA: Diagnosis present

## 2019-12-29 DIAGNOSIS — R410 Disorientation, unspecified: Secondary | ICD-10-CM | POA: Diagnosis not present

## 2019-12-29 DIAGNOSIS — Z79891 Long term (current) use of opiate analgesic: Secondary | ICD-10-CM

## 2019-12-29 DIAGNOSIS — G20 Parkinson's disease: Secondary | ICD-10-CM | POA: Diagnosis present

## 2019-12-29 DIAGNOSIS — Z888 Allergy status to other drugs, medicaments and biological substances status: Secondary | ICD-10-CM | POA: Diagnosis not present

## 2019-12-29 DIAGNOSIS — Z881 Allergy status to other antibiotic agents status: Secondary | ICD-10-CM

## 2019-12-29 DIAGNOSIS — M1990 Unspecified osteoarthritis, unspecified site: Secondary | ICD-10-CM | POA: Diagnosis present

## 2019-12-29 DIAGNOSIS — K219 Gastro-esophageal reflux disease without esophagitis: Secondary | ICD-10-CM | POA: Diagnosis present

## 2019-12-29 DIAGNOSIS — Z87891 Personal history of nicotine dependence: Secondary | ICD-10-CM | POA: Diagnosis not present

## 2019-12-29 DIAGNOSIS — I10 Essential (primary) hypertension: Secondary | ICD-10-CM | POA: Diagnosis present

## 2019-12-29 DIAGNOSIS — E1136 Type 2 diabetes mellitus with diabetic cataract: Secondary | ICD-10-CM | POA: Diagnosis present

## 2019-12-29 DIAGNOSIS — F329 Major depressive disorder, single episode, unspecified: Secondary | ICD-10-CM | POA: Diagnosis present

## 2019-12-29 DIAGNOSIS — Z885 Allergy status to narcotic agent status: Secondary | ICD-10-CM | POA: Diagnosis not present

## 2019-12-29 DIAGNOSIS — Z96651 Presence of right artificial knee joint: Secondary | ICD-10-CM | POA: Diagnosis present

## 2019-12-29 DIAGNOSIS — Z20822 Contact with and (suspected) exposure to covid-19: Secondary | ICD-10-CM | POA: Diagnosis present

## 2019-12-29 DIAGNOSIS — Z794 Long term (current) use of insulin: Secondary | ICD-10-CM

## 2019-12-29 DIAGNOSIS — Z791 Long term (current) use of non-steroidal anti-inflammatories (NSAID): Secondary | ICD-10-CM | POA: Diagnosis not present

## 2019-12-29 DIAGNOSIS — Z79899 Other long term (current) drug therapy: Secondary | ICD-10-CM | POA: Diagnosis not present

## 2019-12-29 DIAGNOSIS — F419 Anxiety disorder, unspecified: Secondary | ICD-10-CM | POA: Diagnosis present

## 2019-12-29 HISTORY — DX: Fatty (change of) liver, not elsewhere classified: K76.0

## 2019-12-29 HISTORY — DX: Gastro-esophageal reflux disease without esophagitis: K21.9

## 2019-12-29 HISTORY — DX: Headache, unspecified: R51.9

## 2019-12-29 HISTORY — DX: Anxiety disorder, unspecified: F41.9

## 2019-12-29 HISTORY — PX: SUBTHALAMIC STIMULATOR INSERTION: SHX5375

## 2019-12-29 HISTORY — DX: Presence of external hearing-aid: Z97.4

## 2019-12-29 HISTORY — DX: Unspecified hearing loss, unspecified ear: H91.90

## 2019-12-29 HISTORY — DX: Unspecified cataract: H26.9

## 2019-12-29 LAB — CBC
HCT: 39.7 % (ref 39.0–52.0)
Hemoglobin: 12.5 g/dL — ABNORMAL LOW (ref 13.0–17.0)
MCH: 24.4 pg — ABNORMAL LOW (ref 26.0–34.0)
MCHC: 31.5 g/dL (ref 30.0–36.0)
MCV: 77.5 fL — ABNORMAL LOW (ref 80.0–100.0)
Platelets: 241 10*3/uL (ref 150–400)
RBC: 5.12 MIL/uL (ref 4.22–5.81)
RDW: 15.3 % (ref 11.5–15.5)
WBC: 5.6 10*3/uL (ref 4.0–10.5)
nRBC: 0 % (ref 0.0–0.2)

## 2019-12-29 LAB — COMPREHENSIVE METABOLIC PANEL
ALT: 60 U/L — ABNORMAL HIGH (ref 0–44)
AST: 37 U/L (ref 15–41)
Albumin: 3.8 g/dL (ref 3.5–5.0)
Alkaline Phosphatase: 43 U/L (ref 38–126)
Anion gap: 10 (ref 5–15)
BUN: 17 mg/dL (ref 8–23)
CO2: 26 mmol/L (ref 22–32)
Calcium: 9.2 mg/dL (ref 8.9–10.3)
Chloride: 98 mmol/L (ref 98–111)
Creatinine, Ser: 1.04 mg/dL (ref 0.61–1.24)
GFR calc Af Amer: >60 mL/min (ref >60)
GFR calc non Af Amer: >60 mL/min (ref >60)
Glucose, Bld: 201 mg/dL — ABNORMAL HIGH (ref 70–99)
Potassium: 4.2 mmol/L (ref 3.5–5.1)
Sodium: 134 mmol/L — ABNORMAL LOW (ref 135–145)
Total Bilirubin: 1 mg/dL (ref 0.3–1.2)
Total Protein: 7.5 g/dL (ref 6.5–8.1)

## 2019-12-29 LAB — TYPE AND SCREEN
ABO/RH(D): A POS
Antibody Screen: NEGATIVE

## 2019-12-29 LAB — ABO/RH: ABO/RH(D): A POS

## 2019-12-29 LAB — GLUCOSE, CAPILLARY
Glucose-Capillary: 214 mg/dL — ABNORMAL HIGH (ref 70–99)
Glucose-Capillary: 222 mg/dL — ABNORMAL HIGH (ref 70–99)
Glucose-Capillary: 258 mg/dL — ABNORMAL HIGH (ref 70–99)
Glucose-Capillary: 204 mg/dL — ABNORMAL HIGH (ref 70–99)

## 2019-12-29 LAB — HEMOGLOBIN A1C
Hgb A1c MFr Bld: 9.6 % — ABNORMAL HIGH (ref 4.8–5.6)
Mean Plasma Glucose: 228.82 mg/dL

## 2019-12-29 SURGERY — SUBTHALAMIC STIMULATOR INSERTION
Anesthesia: Monitor Anesthesia Care | Site: Head | Laterality: Right

## 2019-12-29 MED ORDER — PROPOFOL 10 MG/ML IV BOLUS
INTRAVENOUS | Status: AC
  Filled 2019-12-29: qty 20

## 2019-12-29 MED ORDER — OXYCODONE HCL 5 MG PO TABS
5.0000 mg | ORAL_TABLET | ORAL | Status: DC | PRN
  Administered 2019-12-30 – 2019-12-31 (×4): 5 mg via ORAL
  Filled 2019-12-29 (×5): qty 1

## 2019-12-29 MED ORDER — LACTATED RINGERS IV SOLN: INTRAVENOUS | Status: DC | PRN

## 2019-12-29 MED ORDER — BISACODYL 10 MG RE SUPP: 10.0000 mg | Freq: Every day | RECTAL | Status: DC | PRN

## 2019-12-29 MED ORDER — 0.9 % SODIUM CHLORIDE (POUR BTL) OPTIME
TOPICAL | Status: DC | PRN
  Administered 2019-12-29: 1000 mL

## 2019-12-29 MED ORDER — TRAZODONE HCL 50 MG PO TABS
50.0000 mg | ORAL_TABLET | Freq: Every day | ORAL | Status: DC
  Administered 2019-12-29 – 2019-12-30 (×2): 50 mg via ORAL
  Filled 2019-12-29 (×2): qty 1

## 2019-12-29 MED ORDER — OXYCODONE HCL 5 MG PO TABS
10.0000 mg | ORAL_TABLET | ORAL | Status: DC | PRN
  Administered 2019-12-29 – 2019-12-30 (×3): 10 mg via ORAL
  Filled 2019-12-29 (×3): qty 2

## 2019-12-29 MED ORDER — LIDOCAINE-EPINEPHRINE 1 %-1:100000 IJ SOLN
INTRAMUSCULAR | Status: AC
  Filled 2019-12-29: qty 1

## 2019-12-29 MED ORDER — SODIUM CHLORIDE 0.9% FLUSH
3.0000 mL | Freq: Two times a day (BID) | INTRAVENOUS | Status: DC
  Administered 2019-12-29 – 2019-12-30 (×3): 3 mL via INTRAVENOUS

## 2019-12-29 MED ORDER — ONDANSETRON HCL 4 MG/2ML IJ SOLN
INTRAMUSCULAR | Status: DC | PRN
  Administered 2019-12-29 (×2): 4 mg via INTRAVENOUS

## 2019-12-29 MED ORDER — CARBIDOPA-LEVODOPA 25-100 MG PO TABS
1.0000 | ORAL_TABLET | Freq: Three times a day (TID) | ORAL | Status: DC
  Administered 2019-12-29 – 2019-12-31 (×6): 1 via ORAL
  Filled 2019-12-29 (×6): qty 1

## 2019-12-29 MED ORDER — INSULIN ASPART 100 UNIT/ML ~~LOC~~ SOLN
0.0000 [IU] | Freq: Three times a day (TID) | SUBCUTANEOUS | Status: DC
  Administered 2019-12-29: 17:00:00 8 [IU] via SUBCUTANEOUS
  Administered 2019-12-30: 5 [IU] via SUBCUTANEOUS
  Administered 2019-12-30: 2 [IU] via SUBCUTANEOUS
  Administered 2019-12-30 – 2019-12-31 (×2): 3 [IU] via SUBCUTANEOUS

## 2019-12-29 MED ORDER — ONDANSETRON HCL 4 MG/2ML IJ SOLN
4.0000 mg | Freq: Four times a day (QID) | INTRAMUSCULAR | Status: DC | PRN
  Administered 2019-12-29 – 2019-12-30 (×2): 4 mg via INTRAVENOUS
  Filled 2019-12-29 (×2): qty 2

## 2019-12-29 MED ORDER — POLYETHYLENE GLYCOL 3350 17 G PO PACK: 17.0000 g | PACK | Freq: Every day | ORAL | Status: DC | PRN

## 2019-12-29 MED ORDER — CHLORHEXIDINE GLUCONATE CLOTH 2 % EX PADS: 6.0000 | MEDICATED_PAD | Freq: Once | CUTANEOUS | Status: DC

## 2019-12-29 MED ORDER — INSULIN GLARGINE 100 UNIT/ML ~~LOC~~ SOLN
52.0000 [IU] | Freq: Every day | SUBCUTANEOUS | Status: DC
  Administered 2019-12-29 – 2019-12-30 (×2): 52 [IU] via SUBCUTANEOUS
  Filled 2019-12-29 (×3): qty 0.52

## 2019-12-29 MED ORDER — BUPIVACAINE HCL 0.5 % IJ SOLN
INTRAMUSCULAR | Status: DC | PRN
  Administered 2019-12-29: 27 mL

## 2019-12-29 MED ORDER — KCL IN DEXTROSE-NACL 20-5-0.45 MEQ/L-%-% IV SOLN: INTRAVENOUS | Status: DC

## 2019-12-29 MED ORDER — BACITRACIN ZINC 500 UNIT/GM EX OINT
TOPICAL_OINTMENT | CUTANEOUS | Status: AC
  Filled 2019-12-29: qty 28.35

## 2019-12-29 MED ORDER — DARIFENACIN HYDROBROMIDE ER 7.5 MG PO TB24
7.5000 mg | ORAL_TABLET | Freq: Every day | ORAL | Status: DC
  Administered 2019-12-29 – 2019-12-31 (×3): 7.5 mg via ORAL
  Filled 2019-12-29 (×3): qty 1

## 2019-12-29 MED ORDER — DEXMEDETOMIDINE HCL IN NACL 200 MCG/50ML IV SOLN
INTRAVENOUS | Status: DC | PRN
  Administered 2019-12-29: 2 ug via INTRAVENOUS
  Administered 2019-12-29: 8 ug via INTRAVENOUS
  Administered 2019-12-29: 6 ug via INTRAVENOUS

## 2019-12-29 MED ORDER — PHENOL 1.4 % MT LIQD: 1.0000 | OROMUCOSAL | Status: DC | PRN

## 2019-12-29 MED ORDER — PANTOPRAZOLE SODIUM 40 MG PO TBEC
40.0000 mg | DELAYED_RELEASE_TABLET | Freq: Every day | ORAL | Status: DC
  Administered 2019-12-29 – 2019-12-30 (×2): 40 mg via ORAL
  Filled 2019-12-29 (×2): qty 1

## 2019-12-29 MED ORDER — FENTANYL CITRATE (PF) 250 MCG/5ML IJ SOLN
INTRAMUSCULAR | Status: AC
  Filled 2019-12-29: qty 5

## 2019-12-29 MED ORDER — PRAMIPEXOLE DIHYDROCHLORIDE 0.25 MG PO TABS
0.5000 mg | ORAL_TABLET | Freq: Three times a day (TID) | ORAL | Status: DC
  Administered 2019-12-29 – 2019-12-31 (×6): 0.5 mg via ORAL
  Filled 2019-12-29 (×6): qty 2

## 2019-12-29 MED ORDER — BUPIVACAINE HCL (PF) 0.5 % IJ SOLN
INTRAMUSCULAR | Status: AC
  Filled 2019-12-29: qty 30

## 2019-12-29 MED ORDER — THROMBIN 5000 UNITS EX SOLR
CUTANEOUS | Status: AC
  Filled 2019-12-29: qty 10000

## 2019-12-29 MED ORDER — ONDANSETRON HCL 4 MG/2ML IJ SOLN
INTRAMUSCULAR | Status: AC
  Filled 2019-12-29: qty 4

## 2019-12-29 MED ORDER — BACITRACIN ZINC 500 UNIT/GM EX OINT
TOPICAL_OINTMENT | CUTANEOUS | Status: DC | PRN
  Administered 2019-12-29: 1 via TOPICAL

## 2019-12-29 MED ORDER — RAMIPRIL 10 MG PO CAPS
10.0000 mg | ORAL_CAPSULE | Freq: Every day | ORAL | Status: DC
  Administered 2019-12-29 – 2019-12-31 (×3): 10 mg via ORAL
  Filled 2019-12-29 (×3): qty 1

## 2019-12-29 MED ORDER — GLYCOPYRROLATE PF 0.2 MG/ML IJ SOSY
PREFILLED_SYRINGE | INTRAMUSCULAR | Status: DC | PRN
  Administered 2019-12-29: .2 mg via INTRAVENOUS

## 2019-12-29 MED ORDER — FLEET ENEMA 7-19 GM/118ML RE ENEM: 1.0000 | ENEMA | Freq: Once | RECTAL | Status: DC | PRN

## 2019-12-29 MED ORDER — MIDAZOLAM HCL 2 MG/2ML IJ SOLN
INTRAMUSCULAR | Status: AC
  Filled 2019-12-29: qty 2

## 2019-12-29 MED ORDER — INSULIN ASPART 100 UNIT/ML ~~LOC~~ SOLN
0.0000 [IU] | Freq: Every day | SUBCUTANEOUS | Status: DC
  Administered 2019-12-29: 21:00:00 2 [IU] via SUBCUTANEOUS

## 2019-12-29 MED ORDER — FENTANYL CITRATE (PF) 250 MCG/5ML IJ SOLN
INTRAMUSCULAR | Status: DC | PRN
  Administered 2019-12-29: 25 ug via INTRAVENOUS
  Administered 2019-12-29: 50 ug via INTRAVENOUS

## 2019-12-29 MED ORDER — ACETAMINOPHEN 325 MG PO TABS
650.0000 mg | ORAL_TABLET | ORAL | Status: DC | PRN
  Administered 2019-12-29 – 2019-12-31 (×6): 650 mg via ORAL
  Filled 2019-12-29 (×6): qty 2

## 2019-12-29 MED ORDER — PHENYLEPHRINE 40 MCG/ML (10ML) SYRINGE FOR IV PUSH (FOR BLOOD PRESSURE SUPPORT)
PREFILLED_SYRINGE | INTRAVENOUS | Status: AC
  Filled 2019-12-29: qty 10

## 2019-12-29 MED ORDER — MIDAZOLAM HCL 2 MG/2ML IJ SOLN
INTRAMUSCULAR | Status: DC | PRN
  Administered 2019-12-29: 2 mg via INTRAVENOUS

## 2019-12-29 MED ORDER — PANTOPRAZOLE SODIUM 40 MG IV SOLR: 40.0000 mg | Freq: Every day | INTRAVENOUS | Status: DC

## 2019-12-29 MED ORDER — DEXMEDETOMIDINE HCL IN NACL 200 MCG/50ML IV SOLN
INTRAVENOUS | Status: AC
  Filled 2019-12-29: qty 50

## 2019-12-29 MED ORDER — DOCUSATE SODIUM 100 MG PO CAPS
100.0000 mg | ORAL_CAPSULE | Freq: Two times a day (BID) | ORAL | Status: DC
  Administered 2019-12-29 – 2019-12-31 (×5): 100 mg via ORAL
  Filled 2019-12-29 (×5): qty 1

## 2019-12-29 MED ORDER — OXYCODONE HCL 5 MG PO TABS: 5.0000 mg | ORAL_TABLET | Freq: Four times a day (QID) | ORAL | Status: DC | PRN

## 2019-12-29 MED ORDER — DEXMEDETOMIDINE HCL IN NACL 200 MCG/50ML IV SOLN
INTRAVENOUS | Status: DC | PRN
  Administered 2019-12-29: .1 ug/kg/h via INTRAVENOUS

## 2019-12-29 MED ORDER — LIDOCAINE-EPINEPHRINE 1 %-1:100000 IJ SOLN
INTRAMUSCULAR | Status: DC | PRN
  Administered 2019-12-29: 27 mL

## 2019-12-29 MED ORDER — HYDROMORPHONE HCL 1 MG/ML IJ SOLN
0.5000 mg | INTRAMUSCULAR | Status: DC | PRN
  Administered 2019-12-29: 0.5 mg via INTRAVENOUS
  Filled 2019-12-29: qty 0.5

## 2019-12-29 MED ORDER — SODIUM CHLORIDE 0.9 % IV SOLN: 250.0000 mL | INTRAVENOUS | Status: DC

## 2019-12-29 MED ORDER — ALUM & MAG HYDROXIDE-SIMETH 200-200-20 MG/5ML PO SUSP: 30.0000 mL | Freq: Four times a day (QID) | ORAL | Status: DC | PRN

## 2019-12-29 MED ORDER — ZOLPIDEM TARTRATE 5 MG PO TABS
5.0000 mg | ORAL_TABLET | Freq: Every evening | ORAL | Status: DC | PRN
  Administered 2019-12-30: 5 mg via ORAL
  Filled 2019-12-29: qty 1

## 2019-12-29 MED ORDER — CEFAZOLIN SODIUM-DEXTROSE 2-4 GM/100ML-% IV SOLN: 2.0000 g | Freq: Three times a day (TID) | INTRAVENOUS | Status: DC

## 2019-12-29 MED ORDER — CEFAZOLIN SODIUM-DEXTROSE 2-4 GM/100ML-% IV SOLN
2.0000 g | INTRAVENOUS | Status: AC
  Administered 2019-12-29: 2 g via INTRAVENOUS
  Filled 2019-12-29: qty 100

## 2019-12-29 MED ORDER — SODIUM CHLORIDE 0.9% FLUSH: 3.0000 mL | INTRAVENOUS | Status: DC | PRN

## 2019-12-29 MED ORDER — THROMBIN 5000 UNITS EX SOLR: OROMUCOSAL | Status: DC | PRN

## 2019-12-29 MED ORDER — SODIUM BICARBONATE 4 % IV SOLN
INTRAVENOUS | Status: DC | PRN
  Administered 2019-12-29: 6 mL

## 2019-12-29 MED ORDER — ONDANSETRON HCL 4 MG PO TABS: 4.0000 mg | ORAL_TABLET | Freq: Four times a day (QID) | ORAL | Status: DC | PRN

## 2019-12-29 MED ORDER — CEFAZOLIN SODIUM-DEXTROSE 2-4 GM/100ML-% IV SOLN
2.0000 g | Freq: Three times a day (TID) | INTRAVENOUS | Status: AC
  Administered 2019-12-29 (×2): 2 g via INTRAVENOUS
  Filled 2019-12-29 (×2): qty 100

## 2019-12-29 MED ORDER — MENTHOL 3 MG MT LOZG: 1.0000 | LOZENGE | OROMUCOSAL | Status: DC | PRN

## 2019-12-29 MED ORDER — PHENYLEPHRINE 40 MCG/ML (10ML) SYRINGE FOR IV PUSH (FOR BLOOD PRESSURE SUPPORT)
PREFILLED_SYRINGE | INTRAVENOUS | Status: DC | PRN
  Administered 2019-12-29 (×2): 120 ug via INTRAVENOUS
  Administered 2019-12-29: 80 ug via INTRAVENOUS

## 2019-12-29 MED ORDER — ACETAMINOPHEN 650 MG RE SUPP: 650.0000 mg | RECTAL | Status: DC | PRN

## 2019-12-29 MED ORDER — FENTANYL CITRATE (PF) 100 MCG/2ML IJ SOLN: 25.0000 ug | INTRAMUSCULAR | Status: DC | PRN

## 2019-12-29 MED ORDER — PAROXETINE HCL 30 MG PO TABS
30.0000 mg | ORAL_TABLET | Freq: Every day | ORAL | Status: DC
  Administered 2019-12-29 – 2019-12-31 (×3): 30 mg via ORAL
  Filled 2019-12-29 (×3): qty 1

## 2019-12-29 MED ORDER — PROPOFOL 10 MG/ML IV BOLUS
INTRAVENOUS | Status: DC | PRN
  Administered 2019-12-29 (×5): 10 ug via INTRAVENOUS

## 2019-12-29 MED ORDER — METFORMIN HCL 500 MG PO TABS
1000.0000 mg | ORAL_TABLET | Freq: Two times a day (BID) | ORAL | Status: DC
  Administered 2019-12-29 – 2019-12-31 (×4): 1000 mg via ORAL
  Filled 2019-12-29 (×4): qty 2

## 2019-12-29 MED ORDER — THROMBIN 5000 UNITS EX SOLR
CUTANEOUS | Status: AC
  Filled 2019-12-29: qty 5000

## 2019-12-29 SURGICAL SUPPLY — 69 items
BIT DRILL NEURO 2X3.1 SFT TUCH (MISCELLANEOUS) IMPLANT
BLADE CLIPPER SURG (BLADE) ×3 IMPLANT
BLADE SURG 11 STRL SS (BLADE) ×3 IMPLANT
BNDG ADH 1X3 SHEER STRL LF (GAUZE/BANDAGES/DRESSINGS) IMPLANT
BNDG GAUZE ELAST 4 BULKY (GAUZE/BANDAGES/DRESSINGS) IMPLANT
BOOT SUTURE AID YELLOW STND (SUTURE) ×3 IMPLANT
CABLE EXTENSION OR (MISCELLANEOUS) ×3 IMPLANT
CABLE LEAD 3.0 GUIDELINE 5.0 (CABLE) ×3 IMPLANT
CANISTER SUCT 3000ML PPV (MISCELLANEOUS) ×3 IMPLANT
CARTRIDGE OIL MAESTRO DRILL (MISCELLANEOUS) ×1 IMPLANT
CLIP RANEY DISP (INSTRUMENTS) ×3 IMPLANT
COVER WAND RF STERILE (DRAPES) IMPLANT
DECANTER SPIKE VIAL GLASS SM (MISCELLANEOUS) ×6 IMPLANT
DIFFUSER DRILL AIR PNEUMATIC (MISCELLANEOUS) ×3 IMPLANT
DRAPE STERI IOBAN 125X83 (DRAPES) IMPLANT
DRILL NEURO 2X3.1 SOFT TOUCH (MISCELLANEOUS)
DRSG OPSITE 4X5.5 SM (GAUZE/BANDAGES/DRESSINGS) IMPLANT
DRSG TEGADERM 2-3/8X2-3/4 SM (GAUZE/BANDAGES/DRESSINGS) ×24 IMPLANT
DRSG TEGADERM 4X4.75 (GAUZE/BANDAGES/DRESSINGS) ×3 IMPLANT
DRSG TELFA 3X8 NADH (GAUZE/BANDAGES/DRESSINGS) ×3 IMPLANT
DURAPREP 26ML APPLICATOR (WOUND CARE) ×3 IMPLANT
GAUZE 4X4 16PLY RFD (DISPOSABLE) IMPLANT
GAUZE SPONGE 4X4 12PLY STRL (GAUZE/BANDAGES/DRESSINGS) IMPLANT
GLOVE BIO SURGEON STRL SZ8 (GLOVE) ×3 IMPLANT
GLOVE BIOGEL PI IND STRL 8 (GLOVE) ×1 IMPLANT
GLOVE BIOGEL PI IND STRL 8.5 (GLOVE) ×1 IMPLANT
GLOVE BIOGEL PI INDICATOR 8 (GLOVE) ×2
GLOVE BIOGEL PI INDICATOR 8.5 (GLOVE) ×2
GLOVE ECLIPSE 8.0 STRL XLNG CF (GLOVE) ×3 IMPLANT
GLOVE EXAM NITRILE XL STR (GLOVE) IMPLANT
GOWN STRL REUS W/ TWL LRG LVL3 (GOWN DISPOSABLE) ×1 IMPLANT
GOWN STRL REUS W/ TWL XL LVL3 (GOWN DISPOSABLE) ×1 IMPLANT
GOWN STRL REUS W/TWL 2XL LVL3 (GOWN DISPOSABLE) ×3 IMPLANT
GOWN STRL REUS W/TWL LRG LVL3 (GOWN DISPOSABLE) ×2
GOWN STRL REUS W/TWL XL LVL3 (GOWN DISPOSABLE) ×2
HEMOSTAT POWDER KIT SURGIFOAM (HEMOSTASIS) ×3 IMPLANT
KIT BASIN OR (CUSTOM PROCEDURE TRAY) ×3 IMPLANT
KIT COVER BURR HOLE (Miscellaneous) ×2 IMPLANT
KIT HEADREST PASSIVE (NEUROSURGERY SUPPLIES) ×3 IMPLANT
KIT NEUROSTIM DBS 45 8 CONT (Miscellaneous) ×1 IMPLANT
KIT NEUROSTIM DBS W/BURR COVER (Miscellaneous) ×1 IMPLANT
KIT PLATFORM UNI 4LEG STARFIX (KITS) ×3 IMPLANT
KIT SURETEK BURR HOLE COVER SP (Miscellaneous) ×6 IMPLANT
KIT SURGICAL STARFIX WAYFORM (KITS) ×3 IMPLANT
KIT SUTURE REMOVAL HAMOT (SET/KITS/TRAYS/PACK) ×3 IMPLANT
KIT TURNOVER KIT B (KITS) ×3 IMPLANT
LEAD KIT DBS DIRECTIONAL 45C (Miscellaneous) ×2 IMPLANT
MARKER SKIN DUAL TIP RULER LAB (MISCELLANEOUS) ×6 IMPLANT
NEEDLE HYPO 25X1 1.5 SAFETY (NEEDLE) ×6 IMPLANT
NEEDLE SPNL 18GX3.5 QUINCKE PK (NEEDLE) IMPLANT
NEEDLE SPNL 22GX3.5 QUINCKE BK (NEEDLE) IMPLANT
NS IRRIG 1000ML POUR BTL (IV SOLUTION) ×3 IMPLANT
OIL CARTRIDGE MAESTRO DRILL (MISCELLANEOUS) ×3
PACK LAMINECTOMY NEURO (CUSTOM PROCEDURE TRAY) ×3 IMPLANT
PAD ARMBOARD 7.5X6 YLW CONV (MISCELLANEOUS) ×9 IMPLANT
PERFORATOR LRG  14-11MM (BIT) ×2
PERFORATOR LRG 14-11MM (BIT) ×1 IMPLANT
PLATEFORM ARRAY DZAP LEADPOINT (MISCELLANEOUS) ×4
PLATFORM ARRAY DZAP LEADPOINT (MISCELLANEOUS) ×2 IMPLANT
PLATFORM BILATERAL KIT (MISCELLANEOUS) IMPLANT
SPONGE SURGIFOAM ABS GEL SZ50 (HEMOSTASIS) IMPLANT
STAPLER SKIN PROX WIDE 3.9 (STAPLE) ×6 IMPLANT
SUT ETHILON 3 0 PS 1 (SUTURE) IMPLANT
SUT SILK 2 0 TIES 10X30 (SUTURE) ×3 IMPLANT
SUT VIC AB 2-0 CP2 18 (SUTURE) ×6 IMPLANT
TOWEL GREEN STERILE (TOWEL DISPOSABLE) ×3 IMPLANT
TOWEL GREEN STERILE FF (TOWEL DISPOSABLE) ×3 IMPLANT
TRAY FOLEY MTR SLVR 16FR STAT (SET/KITS/TRAYS/PACK) IMPLANT
WATER STERILE IRR 1000ML POUR (IV SOLUTION) ×3 IMPLANT

## 2019-12-29 NOTE — Anesthesia Procedure Notes (Signed)
Procedure Name: MAC Date/Time: 12/29/2019 8:08 AM Performed by: Janace Litten, CRNA Pre-anesthesia Checklist: Patient identified, Emergency Drugs available, Suction available and Patient being monitored Patient Re-evaluated:Patient Re-evaluated prior to induction Oxygen Delivery Method: Nasal cannula

## 2019-12-29 NOTE — Interval H&P Note (Signed)
History and Physical Interval Note:  12/29/2019 7:39 AM  Bill Bowman  has presented today for surgery, with the diagnosis of Parkinson Disease.  The various methods of treatment have been discussed with the patient and family. After consideration of risks, benefits and other options for treatment, the patient has consented to  Procedure(s) with comments: Right Deep Brain Stimulator placement (Right) - Right Deep Brain Stimulator placement as a surgical intervention.  The patient's history has been reviewed, patient examined, no change in status, stable for surgery.  I have reviewed the patient's chart and labs.  Questions were answered to the patient's satisfaction.     [name]

## 2019-12-29 NOTE — Op Note (Signed)
12/29/2019  11:10 AM  PATIENT:  Bill Bowman  69 y.o. male  PRE-OPERATIVE DIAGNOSIS:  Parkinson Disease  POST-OPERATIVE DIAGNOSIS:  Parkinson Disease  PROCEDURE:  Procedure(s): Right Deep Brain Stimulator placement (Right)  SURGEON:  Surgeon(s) and Role:    Erline Levine, MD - Primary  PHYSICIAN ASSISTANT:   ASSISTANTS: Poteat, RN   ANESTHESIA:   local and IV sedation  EBL:  Minimal  BLOOD ADMINISTERED:none  DRAINS: none   LOCAL MEDICATIONS USED:  MARCAINE    and LIDOCAINE   SPECIMEN:  No Specimen  DISPOSITION OF SPECIMEN:  N/A  COUNTS:  YES  TOURNIQUET:  * No tourniquets in log *  DICTATION: Indications: Patient is a 69 year old man with Parkinson's Disease it was elected to take him to surgery for right STN deep brain stimulator electrode placement  Procedure: Preoperative planing was performed with volumetricCT and placement of 4 fiducial markers in the skull followed by CT of the brain also obtained volumetrically. These were then exported to create Starfix head frame with planned targeting of STN nucleus electrodes was performed. The patient was brought to the operating room and placed in a semi-Fowler's position with her neck and stabilized in a neck holder. This was affixed to the [place]field adapter. [name] was then prepped with betadine scrub and DuraPrep and subsequently draped with an Ioban drape.with the fiducials and the skin was infiltrated with local lidocaine.  The areas of planned incision were then infiltrated with local lidocaine with epinephrine. The stepoffs were connected and the Starfix frame was assembled.  The entry points were then marked.  A curvilinear incision was made centered on the right entry point. An elevator was used to clear pericranium from the skull. The perforator it was then used to produce a 14 mm bur hole. The dura was coagulated with bipolar electrocautery.  After opening the dura and a localizing the entry point the  stylette and outer cannula were inserted into the brain. Surgifoam was placed to prevent CSF leakage at the entry site. Microelectrode recordings were then performed and  we had very good recordings from the STN. Subsequently the stimulating electrode was placed and the patient had significant improvement in rigidity and tremor on the left side of the body without significant side effects to higher voltages. The electrode was then locked into position with the stimlock cap. The redundant electrode was circularized and tunneled under the scalp. The electrode was tunneled to the left and then into the posterior scalp and locked into position. The wounds were then irrigated and closed with 2-0 Vicryl sutures and staples. The fiducials were removed and staples were placed over each of these sites. The head was washed and then sterile occlusive dressings were placed. The patient was taken to recovery having tolerated her procedure without difficulty or untoward effect. Please refer to detailed microelectrode recordings from [name] for more specifics of the positioning of the electrodes. These are included in her Epic note from the detailed neural monitoring and physical exam assessment during the surgery.   PLAN OF CARE: Admit to inpatient   PATIENT DISPOSITION:  PACU - hemodynamically stable.   Delay start of Pharmacological VTE agent (>24hrs) due to surgical blood loss or risk of bleeding: yes

## 2019-12-29 NOTE — Anesthesia Preprocedure Evaluation (Addendum)
Anesthesia Evaluation  Patient identified by MRN, date of birth, ID band Patient awake    Reviewed: Allergy & Precautions, H&P , NPO status , Patient's Chart, lab work & pertinent test results  Airway Mallampati: II  TM Distance: >3 FB Neck ROM: Full    Dental no notable dental hx. (+) Teeth Intact, Dental Advisory Given   Pulmonary neg pulmonary ROS, former smoker,    Pulmonary exam normal breath sounds clear to auscultation       Cardiovascular hypertension, Pt. on medications  Rhythm:Regular Rate:Normal     Neuro/Psych  Headaches, Anxiety Depression  Neuromuscular disease    GI/Hepatic Neg liver ROS, GERD  Medicated,  Endo/Other  diabetes, Type 2, Oral Hypoglycemic Agents  Renal/GU negative Renal ROS  negative genitourinary   Musculoskeletal  (+) Arthritis , Osteoarthritis,    Abdominal   Peds  Hematology negative hematology ROS (+)   Anesthesia Other Findings   Reproductive/Obstetrics negative OB ROS                            Anesthesia Physical Anesthesia Plan  ASA: III  Anesthesia Plan: MAC   Post-op Pain Management:    Induction: Intravenous  PONV Risk Score and Plan: 2 and Treatment may vary due to age or medical condition  Airway Management Planned: Simple Face Mask  Additional Equipment:   Intra-op Plan:   Post-operative Plan:   Informed Consent: I have reviewed the patients History and Physical, chart, labs and discussed the procedure including the risks, benefits and alternatives for the proposed anesthesia with the patient or authorized representative who has indicated his/her understanding and acceptance.     Dental advisory given  Plan Discussed with: CRNA  Anesthesia Plan Comments:        Anesthesia Quick Evaluation

## 2019-12-29 NOTE — Transfer of Care (Signed)
Immediate Anesthesia Transfer of Care Note  Patient: Bill Bowman  Procedure(s) Performed: Right Deep Brain Stimulator placement (Right Head)  Patient Location: PACU  Anesthesia Type:MAC  Level of Consciousness: awake, alert  and patient cooperative  Airway & Oxygen Therapy: Patient Spontanous Breathing and Patient connected to nasal cannula oxygen  Post-op Assessment: Report given to RN and Post -op Vital signs reviewed and stable  Post vital signs: Reviewed and stable  Last Vitals:  Vitals Value Taken Time  BP 116/77 12/29/19 1105  Temp    Pulse 94 12/29/19 1107  Resp 18 12/29/19 1107  SpO2 95 % 12/29/19 1107  Vitals shown include unvalidated device data.  Last Pain:  Vitals:   12/29/19 0646  TempSrc: Oral  PainSc:          Complications: No apparent anesthesia complications

## 2019-12-29 NOTE — Op Note (Signed)
Preoperative diagnosis:  PD Postoperative diagnosis:  PD Surgeon:  Erline Levine Neurologist:  Wells Guiles Cleone Hulick  Indication for procedure: Determination of optimal electrode position for deep brain stimulation therapy. Complications: None   Description of procedure:  Following the incision and burr hole placement, a recording microelectrode was slowly advanced into the brain via a motorized drive.  Beginning approximately 10 mm above the target, recordings were periodically made, and the resultant brain activity was described on the neurophysiologic recording worksheet.  Relevant samples of the recordings were saved for subsequent off line analysis.  A total of one recording pass was obtained on the right side of the brain.    4.85mm of STN  were recorded from the right side of the brain. The sensory-motor subregion of the STN was identified. Following the recording, the recording electrode was replaced by a stimulating electrode by Dr. Vertell Limber.  Test stimulations were then obtained.  Active contacts that were used and the response to stimulation, including both adverse effects and therapeutic effects, were recorded on the neurophysiologic stimulation worksheet.  In brief, there was complete resolution of tremor, rigidity, and bradykinesia at therapeutic voltages.   Adverse effects only occurred at supratherapeutic voltages and consisted of paresthesias.  Final electrode position was determined and the electrode was secured in place by Dr. Vertell Limber on the right side.  Bill Bogus, DO [name] Neu[facility] Director of Movement Disorders

## 2019-12-29 NOTE — Anesthesia Postprocedure Evaluation (Signed)
Anesthesia Post Note  Patient: Bill Bowman  Procedure(s) Performed: Right Deep Brain Stimulator placement (Right Head)     Patient location during evaluation: PACU Anesthesia Type: MAC Level of consciousness: awake and alert Pain management: pain level controlled Vital Signs Assessment: post-procedure vital signs reviewed and stable Respiratory status: spontaneous breathing, nonlabored ventilation, respiratory function stable and patient connected to nasal cannula oxygen Cardiovascular status: stable and blood pressure returned to baseline Postop Assessment: no apparent nausea or vomiting Anesthetic complications: no    Last Vitals:  Vitals:   12/29/19 1451 12/29/19 1452  BP:  (!) 150/93  Pulse: 88 88  Resp: 18 19  Temp: 36.4 C   SpO2: 92% 93%    Last Pain:  Vitals:   12/29/19 1451  TempSrc: Oral  PainSc:                  [place]

## 2019-12-29 NOTE — Brief Op Note (Signed)
12/29/2019  11:10 AM  PATIENT:  Bill Bowman  69 y.o. male  PRE-OPERATIVE DIAGNOSIS:  Parkinson Disease  POST-OPERATIVE DIAGNOSIS:  Parkinson Disease  PROCEDURE:  Procedure(s): Right Deep Brain Stimulator placement (Right)  SURGEON:  Surgeon(s) and Role:    Erline Levine, MD - Primary  PHYSICIAN ASSISTANT:   ASSISTANTS: Poteat, RN   ANESTHESIA:   local and IV sedation  EBL:  Minimal  BLOOD ADMINISTERED:none  DRAINS: none   LOCAL MEDICATIONS USED:  MARCAINE    and LIDOCAINE   SPECIMEN:  No Specimen  DISPOSITION OF SPECIMEN:  N/A  COUNTS:  YES  TOURNIQUET:  * No tourniquets in log *  DICTATION: Indications: Patient is a 69 year old man with Parkinson's Disease it was elected to take him to surgery for right STN deep brain stimulator electrode placement  Procedure: Preoperative planing was performed with volumetricCT and placement of 4 fiducial markers in the skull followed by CT of the brain also obtained volumetrically. These were then exported to create Starfix head frame with planned targeting of STN nucleus electrodes was performed. The patient was brought to the operating room and placed in a semi-Fowler's position with her neck and stabilized in a neck holder. This was affixed to the Mayfield adapter. [name] was then prepped with betadine scrub and DuraPrep and subsequently draped with an Ioban drape.with the fiducials and the skin was infiltrated with local lidocaine.  The areas of planned incision were then infiltrated with local lidocaine with epinephrine. The stepoffs were connected and the Starfix frame was assembled.  The entry points were then marked.  A curvilinear incision was made centered on the right entry point. An elevator was used to clear pericranium from the skull. The perforator it was then used to produce a 14 mm bur hole. The dura was coagulated with bipolar electrocautery.  After opening the dura and a localizing the entry point the  stylette and outer cannula were inserted into the brain. Surgifoam was placed to prevent CSF leakage at the entry site. Microelectrode recordings were then performed and  we had very good recordings from the STN. Subsequently the stimulating electrode was placed and the patient had significant improvement in rigidity and tremor on the left side of the body without significant side effects to higher voltages. The electrode was then locked into position with the stimlock cap. The redundant electrode was circularized and tunneled under the scalp. The electrode was tunneled to the left and then into the posterior scalp and locked into position. The wounds were then irrigated and closed with 2-0 Vicryl sutures and staples. The fiducials were removed and staples were placed over each of these sites. The head was washed and then sterile occlusive dressings were placed. The patient was taken to recovery having tolerated her procedure without difficulty or untoward effect. Please refer to detailed microelectrode recordings from [name] for more specifics of the positioning of the electrodes. These are included in her Epic note from the detailed neural monitoring and physical exam assessment during the surgery.   PLAN OF CARE: Admit to inpatient   PATIENT DISPOSITION:  PACU - hemodynamically stable.   Delay start of Pharmacological VTE agent (>24hrs) due to surgical blood loss or risk of bleeding: yes

## 2019-12-29 NOTE — Progress Notes (Signed)
Awake, alert, conversant.  MAEW with good strength.  Vocal, verbal, fluent speech.  Dressings CDI.  Doing well.

## 2019-12-30 ENCOUNTER — Encounter: Payer: Self-pay | Admitting: *Deleted

## 2019-12-30 ENCOUNTER — Inpatient Hospital Stay (HOSPITAL_COMMUNITY): Payer: Medicare Other

## 2019-12-30 LAB — GLUCOSE, CAPILLARY
Glucose-Capillary: 225 mg/dL — ABNORMAL HIGH (ref 70–99)
Glucose-Capillary: 144 mg/dL — ABNORMAL HIGH (ref 70–99)
Glucose-Capillary: 191 mg/dL — ABNORMAL HIGH (ref 70–99)
Glucose-Capillary: 165 mg/dL — ABNORMAL HIGH (ref 70–99)

## 2019-12-30 MED ORDER — HYDROXYZINE HCL 50 MG/ML IM SOLN
50.0000 mg | INTRAMUSCULAR | Status: DC | PRN
  Administered 2019-12-30: 05:00:00 50 mg via INTRAMUSCULAR
  Filled 2019-12-30: qty 1

## 2019-12-30 NOTE — Progress Notes (Deleted)
Subjective: Patient reports "I'm not really having pain, just some numbness, but it's better than before"  Objective: Vital signs in last 24 hours: Temp:  [97.6 F (36.4 C)-99.1 F (37.3 C)] 98.4 F (36.9 C) (01/22 1129) Pulse Rate:  [85-100] 85 (01/22 1129) Resp:  [18-20] 18 (01/22 1129) BP: (131-161)/(83-93) 131/89 (01/22 1129) SpO2:  [90 %-94 %] 91 % (01/22 1129)  Intake/Output from previous day: 01/21 0701 - 01/22 0700 In: 1500 [P.O.:600; I.V.:800; IV Piggyback:100] Out: 3 [Urine:3] Intake/Output this shift: No intake/output data recorded.  Alert, conversant. MAEW. Good strength BLE. Incision without erythema, swelling or drainage beneath honeycomb and dermabond. She reports no pain at present, noting improving (decreased) numbness left foot and both thighs.   Lab Results: Recent Labs    12/29/19 0625  WBC 5.6  HGB 12.5*  HCT 39.7  PLT 241   BMET Recent Labs    12/29/19 0625  NA 134*  K 4.2  CL 98  CO2 26  GLUCOSE 201*  BUN 17  CREATININE 1.04  CALCIUM 9.2    Studies/Results: CT HEAD WO CONTRAST  Result Date: 12/30/2019 CLINICAL DATA:  Parkinson's disease. Preoperative evaluation for neurostimulator placement. EXAM: CT HEAD WITHOUT CONTRAST TECHNIQUE: Contiguous axial images were obtained from the base of the skull through the vertex without intravenous contrast. COMPARISON:  12/20/2019 FINDINGS: Brain: Age related atrophy. Neurostimulator placed on the right with the lead tip in the right lateral mid brain/cerebral peduncle. No sign of hemorrhagic complication or unexpected edema. The remainder the brain remains negative. No hydrocephalus. No extra-axial collection. Small amount of subdural and/or subarachnoid air at the vertex. Lea takes 2 loops within the tissues of the scalp adjacent to the burr hole. Vascular: No vascular finding. Skull: Otherwise negative Sinuses/Orbits: Clear/normal Other: Electrode appears unattached, with the terminating portion in the  scalp to the left of midline. IMPRESSION: Electrode placement on the right for treatment of Parkinson's disease. No complicating feature. Electronically Signed   By: Nelson Chimes M.D.   On: 12/30/2019 08:13    Assessment/Plan: Improving  LOS: 1 day  Ok per DrStern to d/c to home. Pt verbalizes understanding of d/c instructions and agrees to call office to schedule 3 week f/u appt. Rx's Tramadol 50mg  and Robaxin 500mg  will be eRx'ed to her pharmacy.   Verdis Prime 12/30/2019, 1:00 PM

## 2019-12-30 NOTE — Evaluation (Signed)
Physical Therapy Evaluation Patient Details Name: Bill Bowman MRN: [id number] DOB: [birth year] Today's Date: 12/30/2019   History of Present Illness  Pt is a 69 y/o male who presents s/p sunthalamic stimulator insertion on 12/29/2019. PMH significant for Vertigo, Parkinson's Disease, HTN, DM, R TKR, hernia repair.   Clinical Impression  Pt admitted with above diagnosis. At the time of PT eval pt was limited by lethargy - likely from medications. Supportive daughter present and provided history. Anticipate that pt will progress well but mobility was limited this session due to lethargy as well. Pt will have to be able to negotiate 8 stairs to enter home upon d/c - will attempt a second session this afternoon to initiate stair training if plan is for d/c today. Pt currently with functional limitations due to the deficits listed below (see PT Problem List). Pt will benefit from skilled PT to increase their independence and safety with mobility to allow discharge to the venue listed below.       Follow Up Recommendations No PT follow up;Supervision/Assistance - 24 hour    Equipment Recommendations  None recommended by PT    Recommendations for Other Services       Precautions / Restrictions Precautions Precautions: Fall Restrictions Weight Bearing Restrictions: No      Mobility  Bed Mobility               General bed mobility comments: Pt received sitting up in recliner  Transfers Overall transfer level: Needs assistance Equipment used: Rolling walker (2 wheeled) Transfers: Sit to/from Stand Sit to Stand: Min assist         General transfer comment: Assist to power-up to full standing position. VC's for hand placement on seated surface for safety - pt pulled to stand from walker with daughter stabilizing walker and preventing tipping.   Ambulation/Gait Ambulation/Gait assistance: Min assist Gait Distance (Feet): 75 Feet Assistive device: Rolling walker (2  wheeled) Gait Pattern/deviations: Decreased stride length;Shuffle;Trunk flexed;Narrow base of support Gait velocity: Decreased Gait velocity interpretation: <1.31 ft/sec, indicative of household ambulator General Gait Details: Parkinsonian gait pattern. Pt required cues for eyes open. Able to adjust and maneuver walker with assist when running into obstacles.   Stairs            Wheelchair Mobility    Modified Rankin (Stroke Patients Only)       Balance Overall balance assessment: Needs assistance Sitting-balance support: Feet supported;No upper extremity supported Sitting balance-Leahy Scale: Poor Sitting balance - Comments: posterior lean   Standing balance support: Bilateral upper extremity supported;During functional activity Standing balance-Leahy Scale: Poor                               Pertinent Vitals/Pain Pain Assessment: Faces Faces Pain Scale: Hurts even more Pain Location: Head/incision sites Pain Descriptors / Indicators: Operative site guarding;Grimacing Pain Intervention(s): Limited activity within patient's tolerance;Monitored during session;Repositioned    Home Living Family/patient expects to be discharged to:: Private residence Living Arrangements: Spouse/significant other Available Help at Discharge: Family;Available 24 hours/day Type of Home: House Home Access: Stairs to enter Entrance Stairs-Rails: Psychiatric nurse of Steps: 8 Home Layout: Two level;Able to live on main level with bedroom/bathroom Home Equipment: Gilford Rile - 2 wheels;Cane - single point;Adaptive equipment      Prior Function Level of Independence: Independent with assistive device(s)         Comments: Mostly independent however daughter reports on days when tremors  were "really bad" he would use the [facility] for support.      Hand Dominance        Extremity/Trunk Assessment   Upper Extremity Assessment Upper Extremity Assessment: Defer to OT  evaluation    Lower Extremity Assessment Lower Extremity Assessment: Generalized weakness    Cervical / Trunk Assessment Cervical / Trunk Assessment: Other exceptions Cervical / Trunk Exceptions: forward head posture with rounded shoulders.   Communication   Communication: HOH(Hearing aide on L)  Cognition Arousal/Alertness: Lethargic;Suspect due to medications Behavior During Therapy: Flat affect Overall Cognitive Status: Impaired/Different from baseline Area of Impairment: Problem solving;Awareness;Memory;Safety/judgement;Attention                   Current Attention Level: Focused Memory: Decreased short-term memory   Safety/Judgement: Decreased awareness of safety;Decreased awareness of deficits Awareness: Intellectual Problem Solving: Slow processing;Decreased initiation;Requires verbal cues;Requires tactile cues General Comments: Pt very sleepy 2 meds. Required increased time to follow commands and required cues for eyes open. Anticipate return to baseline when meds clear his system.       General Comments      Exercises     Assessment/Plan    PT Assessment Patient needs continued PT services  PT Problem List Decreased strength;Decreased activity tolerance;Decreased balance;Decreased mobility;Decreased knowledge of use of DME;Decreased safety awareness;Decreased knowledge of precautions;Pain       PT Treatment Interventions DME instruction;Gait training;Functional mobility training;Therapeutic activities;Therapeutic exercise;Neuromuscular re-education;Patient/family education    PT Goals (Current goals can be found in the Care Plan section)  Acute Rehab PT Goals Patient Stated Goal: Go to sleep PT Goal Formulation: With patient/family Time For Goal Achievement: 01/06/20 Potential to Achieve Goals: Good    Frequency Min 5X/week   Barriers to discharge        Co-evaluation               AM-PAC PT "6 Clicks" Mobility  Outcome Measure Help  needed turning from your back to your side while in a flat bed without using bedrails?: None Help needed moving from lying on your back to sitting on the side of a flat bed without using bedrails?: A Little Help needed moving to and from a bed to a chair (including a wheelchair)?: A Little Help needed standing up from a chair using your arms (e.g., wheelchair or bedside chair)?: A Little Help needed to walk in hospital room?: A Little Help needed climbing 3-5 steps with a railing? : A Lot 6 Click Score: 18    End of Session Equipment Utilized During Treatment: Gait belt Activity Tolerance: Patient limited by lethargy Patient left: in chair;with call bell/phone within reach;with family/visitor present Nurse Communication: Mobility status PT Visit Diagnosis: Unsteadiness on feet (R26.81);Pain;Other symptoms and signs involving the nervous system (R29.898) Pain - part of body: (head)    Time: [id number] PT Time Calculation (min) (ACUTE ONLY): 25 min   Charges:   PT Evaluation $PT Eval Moderate Complexity: 1 Mod PT Treatments $Gait Training: 8-22 mins        Rolinda Roan, PT, DPT Acute Rehabilitation Services Pager: [phone] Office: [phone]   Thelma Comp 12/30/2019, 11:15 AM

## 2019-12-30 NOTE — Progress Notes (Signed)
Inpatient Diabetes Program Recommendations  AACE/ADA: New Consensus Statement on Inpatient Glycemic Control (2015)  Target Ranges:  Prepandial:   less than 140 mg/dL      Peak postprandial:   less than 180 mg/dL (1-2 hours)      Critically ill patients:  140 - 180 mg/dL   Lab Results  Component Value Date   GLUCAP 191 (H) 12/30/2019   HGBA1C 9.6 (H) 12/29/2019    Review of Glycemic Control Results for Bill Bowman, Bill Bowman (MRN [id number]) as of 12/30/2019 16:34  Ref. Range 12/29/2019 21:03 12/30/2019 06:16 12/30/2019 11:26 12/30/2019 15:02  Glucose-Capillary Latest Ref Range: 70 - 99 mg/dL 204 (H) 225 (H) 144 (H) 191 (H)   Diabetes history: Type 2 DM Outpatient Diabetes medications: Bydureon 2 mg q wk, Toujeo 52 units QHS, Metformin 1000 mg BID Current orders for Inpatient glycemic control: Lantus 52 units QHS, Novolog 0-15 units TID, Novolog 0-5 units QHS, Metformin 1000 mg BID  Inpatient Diabetes Program Recommendations:    Spoke with patient and wife regarding outpatient diabetes management. Patient reports that insulin regimen has fluctuated recently due to multiple surgeries and the course of plan of care.  Reviewed patient's current A1c of 9.6%. Explained what a A1c is and what it measures. Also reviewed goal A1c with patient, importance of good glucose control @ home, and blood sugar goals. Reviewed patho of DM, risk of infection, role of pancreas, vascular changes and commorbidities.  Patient has a meter and checks CBGs 3 times per day. Admits that he has been indulging more with the previous holidays. Patient and wife agree that they will communicate changes and work on this. Will attach a endo list to DC summary if they are interested. Patient has no further questions.   Thanks, Bronson Curb, MSN, RNC-OB Diabetes Coordinator [phone] (8a-5p)

## 2019-12-30 NOTE — Progress Notes (Signed)
Physical Therapy Treatment Patient Details Name: Bill Bowman MRN: [id number] DOB: [birth year] Today's Date: 12/30/2019    History of Present Illness Pt is a 69 y/o male who presents s/p sunthalamic stimulator insertion on 12/29/2019. PMH significant for Vertigo, Parkinson's Disease, HTN, DM, R TKR, hernia repair.     PT Comments    Pt progressing with post-op mobility. He was able to arouse more with afternoon session and demonstrate transfers and ambulation with gross min guard to min assist and +2 generally for safety. Do not anticipate he will continue to require +2 assist after today. We were not able to initiate stair training as pt reporting headache and not able to ambulate distance to stairwell. Plan is for d/c home tomorrow morning - PT to see again for stair training prior to d/c. Will continue to follow.       Follow Up Recommendations  No PT follow up;Supervision/Assistance - 24 hour     Equipment Recommendations  None recommended by PT    Recommendations for Other Services       Precautions / Restrictions Precautions Precautions: Fall Restrictions Weight Bearing Restrictions: No    Mobility  Bed Mobility Overal bed mobility: Needs Assistance Bed Mobility: Supine to Sit     Supine to sit: HOB elevated;Min assist;+2 for physical assistance     General bed mobility comments: Assist from therapists for trunk elevation to full sitting position. PT facilitating anterior lean and OT providing anchor for LUE pull up to sitting.   Transfers Overall transfer level: Needs assistance Equipment used: Rolling walker (2 wheeled) Transfers: Sit to/from Stand Sit to Stand: Min guard         General transfer comment: Pt was able to power up to full standing position without physical assist - hands-on guarding provided for safety and assist provided at walker as pt utilizing walker to pull to stand.   Ambulation/Gait Ambulation/Gait assistance: Min guard;+2  safety/equipment Gait Distance (Feet): 100 Feet Assistive device: Rolling walker (2 wheeled) Gait Pattern/deviations: Decreased stride length;Shuffle;Trunk flexed;Narrow base of support Gait velocity: Decreased, but improved from morning session Gait velocity interpretation: <1.8 ft/sec, indicate of risk for recurrent falls General Gait Details: Parkinsonian gait pattern. Pt able to maneuver walker better this session - +2 for safety as pt still somewhat lethargic.    Stairs             Wheelchair Mobility    Modified Rankin (Stroke Patients Only)       Balance Overall balance assessment: Needs assistance Sitting-balance support: Feet supported;No upper extremity supported Sitting balance-Leahy Scale: Poor Sitting balance - Comments: posterior lean   Standing balance support: Bilateral upper extremity supported;During functional activity Standing balance-Leahy Scale: Poor                              Cognition Arousal/Alertness: Lethargic;Suspect due to medications(Simultaneous filing. User may not have seen previous data.) Behavior During Therapy: Flat affect Overall Cognitive Status: Impaired/Different from baseline Area of Impairment: Problem solving;Awareness;Memory;Safety/judgement;Attention(Simultaneous filing. User may not have seen previous data.)                   Current Attention Level: Focused(Simultaneous filing. User may not have seen previous data.) Memory: Decreased short-term memory(Simultaneous filing. User may not have seen previous data.)   Safety/Judgement: Decreased awareness of safety;Decreased awareness of deficits Awareness: Intellectual(Simultaneous filing. User may not have seen previous data.) Problem Solving: Slow processing;Decreased initiation;Requires verbal cues;Requires  tactile cues General Comments: Pt very sleepy 2 meds. Required increased time to follow commands and required cues for eyes open. Anticipate return  to baseline when meds clear his system. (Simultaneous filing. User may not have seen previous data.)      Exercises      General Comments General comments (skin integrity, edema, etc.): dressing intact. pt making comment about dressing while in bathroom washing hands. educated on clean linens and washing aroudn incsion at hom,e      Pertinent Vitals/Pain Pain Assessment: 0-10 Faces Pain Scale: Hurts even more Pain Location: Head/incision sites Pain Descriptors / Indicators: Operative site guarding;Grimacing Pain Intervention(s): Monitored during session;Repositioned    Home Living Family/patient expects to be discharged to:: Private residence Living Arrangements: Spouse/significant other Available Help at Discharge: Family;Available 24 hours/day Type of Home: House Home Access: Stairs to enter Entrance Stairs-Rails: Right;Left Home Layout: Two level;Able to live on main level with bedroom/bathroom Home Equipment: Gilford Rile - 2 wheels;Cane - single point;Adaptive equipment      Prior Function Level of Independence: Independent with assistive device(s)      Comments: Mostly independent however daughter reports on days when tremors were "really bad" he would use the [facility] for support.    PT Goals (current goals can now be found in the care plan section) Acute Rehab PT Goals Patient Stated Goal: to eat some lunch PT Goal Formulation: With patient/family Time For Goal Achievement: 01/06/20 Potential to Achieve Goals: Good Progress towards PT goals: Progressing toward goals    Frequency    Min 5X/week      PT Plan Current plan remains appropriate    Co-evaluation PT/OT/SLP Co-Evaluation/Treatment: Yes Reason for Co-Treatment: Other (comment)(Lethargic - unsure of tolerance for 2 sessions)          AM-PAC PT "6 Clicks" Mobility   Outcome Measure  Help needed turning from your back to your side while in a flat bed without using bedrails?: None Help needed moving from  lying on your back to sitting on the side of a flat bed without using bedrails?: A Little Help needed moving to and from a bed to a chair (including a wheelchair)?: A Little Help needed standing up from a chair using your arms (e.g., wheelchair or bedside chair)?: A Little Help needed to walk in hospital room?: A Little Help needed climbing 3-5 steps with a railing? : A Lot 6 Click Score: 18    End of Session Equipment Utilized During Treatment: Gait belt Activity Tolerance: Patient limited by lethargy Patient left: in chair;with call bell/phone within reach;with family/visitor present Nurse Communication: Mobility status PT Visit Diagnosis: Unsteadiness on feet (R26.81);Pain;Other symptoms and signs involving the nervous system (R29.898) Pain - part of body: (head)     Time: [id number] PT Time Calculation (min) (ACUTE ONLY): 25 min  Charges:  $Gait Training: 8-22 mins                     Rolinda Roan, PT, DPT Acute Rehabilitation Services Pager: [phone] Office: [phone]    Thelma Comp 12/30/2019, 2:50 PM

## 2019-12-30 NOTE — Evaluation (Signed)
Occupational Therapy Evaluation Patient Details Name: Bill Bowman MRN: [id number] DOB: [birth year] Today's Date: 12/30/2019    History of Present Illness Pt is a 69 y/o male who presents s/p sunthalamic stimulator insertion on 12/29/2019. PMH significant for Vertigo, Parkinson's Disease, HTN, DM, R TKR, hernia repair.    Clinical Impression   Patient is s/p sunthalamic stimulator insertion  Surgery ( part 1 )resulting in functional limitations due to the deficits listed below (see OT problem list). Pt demonstrates cognitive deficits, increased pain and min A  For balance. Patient will benefit from skilled OT acutely to increase independence and safety with ADLS to allow discharge home with wife.     Follow Up Recommendations  No OT follow up    Equipment Recommendations  None recommended by OT    Recommendations for Other Services       Precautions / Restrictions Precautions Precautions: Fall Restrictions Weight Bearing Restrictions: No      Mobility Bed Mobility Overal bed mobility: Needs Assistance Bed Mobility: Supine to Sit     Supine to sit: HOB elevated;Min assist;+2 for physical assistance     General bed mobility comments: Assist from therapists for trunk elevation to full sitting position. PT facilitating anterior lean and OT providing anchor for LUE pull up to sitting.   Transfers Overall transfer level: Needs assistance Equipment used: Rolling walker (2 wheeled) Transfers: Sit to/from Stand Sit to Stand: Min guard         General transfer comment: Pt was able to power up to full standing position without physical assist - hands-on guarding provided for safety and assist provided at walker as pt utilizing walker to pull to stand.     Balance Overall balance assessment: Needs assistance Sitting-balance support: Feet supported;No upper extremity supported Sitting balance-Leahy Scale: Poor Sitting balance - Comments: posterior lean   Standing  balance support: Bilateral upper extremity supported;During functional activity Standing balance-Leahy Scale: Poor                             ADL either performed or assessed with clinical judgement   ADL Overall ADL's : Needs assistance/impaired Eating/Feeding: Modified independent   Grooming: Wash/dry face   Upper Body Bathing: Modified independent   Lower Body Bathing: Min guard         Lower Body Dressing Details (indicate cue type and reason): able to reach feet with hip flexion and crossing Toilet Transfer: Min guard;RW   Writer and Hygiene: Min guard       Functional mobility during ADLs: Min guard;Rolling walker General ADL Comments: pt reports bad headache during session and sensitive to sound     Vision         Perception     Praxis      Pertinent Vitals/Pain Pain Assessment: 0-10 Faces Pain Scale: Hurts even more Pain Location: Head/incision sites Pain Descriptors / Indicators: Operative site guarding;Grimacing Pain Intervention(s): Monitored during session;Repositioned     Hand Dominance Right   Extremity/Trunk Assessment Upper Extremity Assessment Upper Extremity Assessment: RUE deficits/detail;LUE deficits/detail RUE Deficits / Details: tremor able to hold utensils LUE Deficits / Details: tremor able to hold utensils   Lower Extremity Assessment Lower Extremity Assessment: Defer to PT evaluation   Cervical / Trunk Assessment Cervical / Trunk Assessment: Other exceptions Cervical / Trunk Exceptions: forward head posture with rounded shoulders.    Communication Communication Communication: HOH(Hearing aide on L)   Cognition Arousal/Alertness: Lethargic;Suspect  due to medications(Simultaneous filing. User may not have seen previous data.) Behavior During Therapy: Flat affect Overall Cognitive Status: Impaired/Different from baseline Area of Impairment: Problem  solving;Awareness;Memory;Safety/judgement;Attention(Simultaneous filing. User may not have seen previous data.)                   Current Attention Level: Focused(Simultaneous filing. User may not have seen previous data.) Memory: Decreased short-term memory(Simultaneous filing. User may not have seen previous data.)   Safety/Judgement: Decreased awareness of safety;Decreased awareness of deficits Awareness: Intellectual(Simultaneous filing. User may not have seen previous data.) Problem Solving: Slow processing;Decreased initiation;Requires verbal cues;Requires tactile cues General Comments: Pt very sleepy 2 meds. Required increased time to follow commands and required cues for eyes open. Anticipate return to baseline when meds clear his system. (Simultaneous filing. User may not have seen previous data.)   General Comments  dressing intact. pt making comment about dressing while in bathroom washing hands. educated on clean linens and washing aroudn incsion at hom,e    Exercises     Shoulder [place] expects to be discharged to:: Private residence Living Arrangements: Spouse/significant other Available Help at Discharge: Family;Available 24 hours/day Type of Home: House Home Access: Stairs to enter [employer] of Steps: 8 Entrance Stairs-Rails: Right;Left Home Layout: Two level;Able to live on main level with bedroom/bathroom Alternate Level Stairs-Number of Steps: Flight   Bathroom Shower/Tub: Walk-in shower;Tub only   Bathroom Toilet: Standard     Home Equipment: Walker - 2 wheels;Cane - single point;Adaptive equipment Adaptive Equipment: Reacher        Prior Functioning/Environment Level of Independence: Independent with assistive device(s)        Comments: Mostly independent however daughter reports on days when tremors were "really bad" he would use the [facility] for support.         OT Problem List: Decreased  activity tolerance;Impaired balance (sitting and/or standing);Decreased safety awareness;Decreased knowledge of use of DME or AE;Decreased knowledge of precautions;Pain;Obesity      OT Treatment/Interventions: Self-care/ADL training;Therapeutic exercise;Neuromuscular education;Energy conservation;DME and/or AE instruction;Manual therapy;Therapeutic activities;Patient/family education;Balance training    OT Goals(Current goals can be found in the care plan section) Acute Rehab OT Goals Patient Stated Goal: to eat some lunch OT Goal Formulation: With patient Time For Goal Achievement: 01/13/20 Potential to Achieve Goals: Good  OT Frequency: Min 2X/week   Barriers to D/C:            Co-evaluation   Reason for Co-Treatment: Other (comment)(Lethargic - unsure of tolerance for 2 sessions)          AM-PAC OT "6 Clicks" Daily Activity     Outcome Measure Help from another person eating meals?: None Help from another person taking care of personal grooming?: None Help from another person toileting, which includes using toliet, bedpan, or urinal?: A Little Help from another person bathing (including washing, rinsing, drying)?: A Little Help from another person to put on and taking off regular upper body clothing?: None Help from another person to put on and taking off regular lower body clothing?: A Little 6 Click Score: 21   End of Session Equipment Utilized During Treatment: Gait belt;Rolling walker Nurse Communication: Mobility status;Precautions  Activity Tolerance: Patient tolerated treatment well Patient left: in chair;with call bell/phone within reach;with family/visitor present  OT Visit Diagnosis: Unsteadiness on feet (R26.81);Muscle weakness (generalized) (M62.81)                Time:  -  Charges:  OT General Charges $OT Visit: 1 Visit OT Evaluation $OT Eval Moderate Complexity: 1 Mod   Brynn, OTR/L  Acute Rehabilitation Services Pager: [phone] Office:  [phone] .   Jeri Modena 12/30/2019, 2:01 PM

## 2019-12-30 NOTE — Progress Notes (Addendum)
Subjective: Patient reports "I'm ok. Just a rough night last night"  Objective: Vital signs in last 24 hours: Temp:  [97.6 F (36.4 C)-99.1 F (37.3 C)] 98.4 F (36.9 C) (01/22 1129) Pulse Rate:  [85-100] 85 (01/22 1129) Resp:  [18-20] 18 (01/22 1129) BP: (131-161)/(83-93) 131/89 (01/22 1129) SpO2:  [90 %-94 %] 91 % (01/22 1129)  Intake/Output from previous day: 01/21 0701 - 01/22 0700 In: 1500 [P.O.:600; I.V.:800; IV Piggyback:100] Out: 3 [Urine:3] Intake/Output this shift: No intake/output data recorded.  Awakens to voice. Wife present, reports restless night with some confusion. More alert now after resting through the morning. MAEW. PEARL. Scalp incisions with telfa over staples. No bleeding or swelling.   Lab Results: Recent Labs    12/29/19 0625  WBC 5.6  HGB 12.5*  HCT 39.7  PLT 241   BMET Recent Labs    12/29/19 0625  NA 134*  K 4.2  CL 98  CO2 26  GLUCOSE 201*  BUN 17  CREATININE 1.04  CALCIUM 9.2    Studies/Results: CT HEAD WO CONTRAST  Result Date: 12/30/2019 CLINICAL DATA:  Parkinson's disease. Preoperative evaluation for neurostimulator placement. EXAM: CT HEAD WITHOUT CONTRAST TECHNIQUE: Contiguous axial images were obtained from the base of the skull through the vertex without intravenous contrast. COMPARISON:  12/20/2019 FINDINGS: Brain: Age related atrophy. Neurostimulator placed on the right with the lead tip in the right lateral mid brain/cerebral peduncle. No sign of hemorrhagic complication or unexpected edema. The remainder the brain remains negative. No hydrocephalus. No extra-axial collection. Small amount of subdural and/or subarachnoid air at the vertex. Lea takes 2 loops within the tissues of the scalp adjacent to the burr hole. Vascular: No vascular finding. Skull: Otherwise negative Sinuses/Orbits: Clear/normal Other: Electrode appears unattached, with the terminating portion in the scalp to the left of midline. IMPRESSION: Electrode  placement on the right for treatment of Parkinson's disease. No complicating feature. Electronically Signed   By: Nelson Chimes M.D.   On: 12/30/2019 08:13    Assessment/Plan: improving  LOS: 1 day  Will mobilize today and plan for d/c to home in the am. He already has pain med at home for prn use. Dressings may come off at home and he may shower.  Stg # surgery next week for extensions and pulse generator placement.    Verdis Prime 12/30/2019, 1:13 PM  Patient is recovering well.  Will keep in hospital today, anticipate D/C in AM.

## 2019-12-31 LAB — GLUCOSE, CAPILLARY: Glucose-Capillary: 189 mg/dL — ABNORMAL HIGH (ref 70–99)

## 2019-12-31 MED ORDER — OXYCODONE-ACETAMINOPHEN 10-325 MG PO TABS: 1.0000 | ORAL_TABLET | Freq: Four times a day (QID) | ORAL | 0 refills | Status: DC | PRN

## 2019-12-31 MED ORDER — CEPHALEXIN 250 MG PO CAPS: 250.0000 mg | ORAL_CAPSULE | Freq: Four times a day (QID) | ORAL | 0 refills | Status: AC

## 2019-12-31 NOTE — Progress Notes (Signed)
Physical Therapy Treatment Patient Details Name: Bill Bowman MRN: [id number] DOB: 1951/11/22 Today's Date: 12/31/2019    History of Present Illness Pt is a 69 y/o male who presents s/p sunthalamic stimulator insertion on 12/29/2019. PMH significant for Vertigo, Parkinson's Disease, HTN, DM, R TKR, hernia repair.     PT Comments    Pt making steady progress with functional mobility. Pt able to participate in stair training this session with min guard; wife present throughout. Plan is to d/c home today with wife's support.  Follow Up Recommendations  No PT follow up;Supervision/Assistance - 24 hour     Equipment Recommendations  None recommended by PT    Recommendations for Other Services       Precautions / Restrictions Precautions Precautions: Fall Restrictions Weight Bearing Restrictions: No    Mobility  Bed Mobility Overal bed mobility: Needs Assistance Bed Mobility: Supine to Sit     Supine to sit: Supervision     General bed mobility comments: for safety  Transfers Overall transfer level: Needs assistance Equipment used: Rolling walker (2 wheeled) Transfers: Sit to/from Stand Sit to Stand: Min guard         General transfer comment: min guard for safety with transition, good technique utilized  Ambulation/Gait Ambulation/Gait assistance: Counsellor (Feet): 100 Feet Assistive device: Rolling walker (2 wheeled) Gait Pattern/deviations: Step-through pattern;Decreased stride length Gait velocity: able to fluctuate   General Gait Details: pt relatively steady with use of RW; however, with decreased safety awareness   Stairs Stairs: Yes Stairs assistance: Min guard Stair Management: One rail Right;Two rails;Step to pattern;Forwards Number of Stairs: 10 General stair comments: pt steady overall, cueing for safety; wife present   Wheelchair Mobility    Modified Rankin (Stroke Patients Only)       Balance Overall balance  assessment: Needs assistance Sitting-balance support: Feet supported;No upper extremity supported Sitting balance-Leahy Scale: Fair     Standing balance support: Single extremity supported;Bilateral upper extremity supported;No upper extremity supported Standing balance-Leahy Scale: Poor                              Cognition Arousal/Alertness: Awake/alert Behavior During Therapy: Flat affect Overall Cognitive Status: Impaired/Different from baseline Area of Impairment: Safety/judgement                   Current Attention Level: Focused Memory: Decreased short-term memory   Safety/Judgement: Decreased awareness of safety;Decreased awareness of deficits Awareness: Intellectual Problem Solving: Slow processing;Decreased initiation;Requires verbal cues;Requires tactile cues        Exercises      General Comments        Pertinent Vitals/Pain Pain Assessment: Faces Pain Score: 4  Faces Pain Scale: Hurts even more Pain Location: headache Pain Descriptors / Indicators: Grimacing;Headache Pain Intervention(s): Monitored during session;Repositioned    Home Living Family/patient expects to be discharged to:: Private residence Living Arrangements: Spouse/significant other Available Help at Discharge: Family;Available 24 hours/day Type of Home: House Home Access: Stairs to enter Entrance Stairs-Rails: Right;Left Home Layout: Two level;Able to live on main level with bedroom/bathroom Home Equipment: Gilford Rile - 2 wheels;Cane - single point;Adaptive equipment      Prior Function Level of Independence: Independent with assistive device(s)      Comments: Mostly independent however daughter reports on days when tremors were "really bad" he would use the [facility] for support.    PT Goals (current goals can now be found in the care  plan section) Acute Rehab PT Goals PT Goal Formulation: With patient/family Time For Goal Achievement: 01/06/20 Potential to Achieve  Goals: Good Progress towards PT goals: Progressing toward goals    Frequency    Min 5X/week      PT Plan Current plan remains appropriate    Co-evaluation              AM-PAC PT "6 Clicks" Mobility   Outcome Measure  Help needed turning from your back to your side while in a flat bed without using bedrails?: None Help needed moving from lying on your back to sitting on the side of a flat bed without using bedrails?: A Little Help needed moving to and from a bed to a chair (including a wheelchair)?: A Little Help needed standing up from a chair using your arms (e.g., wheelchair or bedside chair)?: A Little Help needed to walk in hospital room?: A Little Help needed climbing 3-5 steps with a railing? : A Little 6 Click Score: 19    End of Session Equipment Utilized During Treatment: Gait belt Activity Tolerance: Patient tolerated treatment well Patient left: in bed;with call bell/phone within reach;with family/visitor present Nurse Communication: Mobility status PT Visit Diagnosis: Unsteadiness on feet (R26.81);Pain;Other symptoms and signs involving the nervous system (R29.898) Pain - part of body: (head, incision site)     Time: [id number] PT Time Calculation (min) (ACUTE ONLY): 14 min  Charges:  $Gait Training: 8-22 mins                     Anastasio Champion, DPT  Acute Rehabilitation Services Pager [phone] Office [place] 12/31/2019, 9:15 AM

## 2019-12-31 NOTE — Discharge Summary (Signed)
Physician Discharge Summary  Patient ID: Bill Bowman MRN: [id number] DOB/AGE: 06-10-1951 69 y.o. Estimated body mass index is 33.58 kg/m as calculated from the following:   Height as of this encounter: 5\' 10"  (1.778 m).   Weight as of this encounter: 106.1 kg.   Admit date: 12/29/2019 Discharge date: 12/31/2019  Admission Diagnoses: DBS implant Discharge Diagnoses: Same Active Problems:   Parkinson's disease [facility])   Discharged Condition: good  Hospital Course: 69 year old woman who presented to hospital 2 days ago underwent deep brain stimulator implant did very well however was a little bit sluggish on postop day 1 was kept an additional day however on postop day 2 patient doing very well ambulating and voiding stable for discharge  Consults: Significant Diagnostic Studies: Treatments: Deep brain stimulator implant Discharge Exam: Blood pressure (!) 151/89, pulse 84, temperature 98.5 F (36.9 C), temperature source Oral, resp. rate 18, height 5\' 10"  (1.778 m), weight 106.1 kg, SpO2 92 %. Strength 5 out of 5 wound clean dry and intact  Disposition: Home  Discharge Instructions    Call MD for:  difficulty breathing, headache or visual disturbances   Complete by: As directed    Call MD for:  hives   Complete by: As directed    Call MD for:  persistant dizziness or light-headedness   Complete by: As directed    Call MD for:  persistant nausea and vomiting   Complete by: As directed    Call MD for:  redness, tenderness, or signs of infection (pain, swelling, redness, odor or green/yellow discharge around incision site)   Complete by: As directed    Call MD for:  severe uncontrolled pain   Complete by: As directed    Call MD for:  temperature >100.4   Complete by: As directed    Diet - low sodium heart healthy   Complete by: As directed    Increase activity slowly   Complete by: As directed      Allergies as of 12/31/2019      Reactions   Clarithromycin  Hypertension   Hydrocodone-homatropine Nausea And Vomiting   Liraglutide Nausea And Vomiting      Medication List    TAKE these medications   aspirin EC 81 MG tablet Take 81 mg by mouth daily.   Bydureon BCise 2 MG/0.85ML Auij Generic drug: Exenatide ER Inject 2 mg into the skin once a week.   carbidopa-levodopa 25-100 MG tablet Commonly known as: Sinemet Take 2 tablets by mouth 3 (three) times daily. What changed: how much to take   cephALEXin 250 MG capsule Commonly known as: KEFLEX Take 1 capsule (250 mg total) by mouth 4 (four) times daily for 10 days.   meloxicam 15 MG tablet Commonly known as: MOBIC Take 15 mg by mouth daily.   metFORMIN 500 MG tablet Commonly known as: GLUCOPHAGE Take 1,000 mg by mouth 2 (two) times daily.   oxyCODONE 5 MG immediate release tablet Commonly known as: Oxy IR/ROXICODONE Take 1 tablet (5 mg total) by mouth every 6 (six) hours as needed for moderate pain.   oxyCODONE-acetaminophen 10-325 MG tablet Commonly known as: Percocet Take 1 tablet by mouth every 6 (six) hours as needed for pain.   PARoxetine 30 MG tablet Commonly known as: PAXIL Take 30 mg by mouth daily.   pramipexole 0.5 MG tablet Commonly known as: MIRAPEX TAKE 1 TABLET BY MOUTH 3 TIMES DAILY   ramipril 10 MG capsule Commonly known as: ALTACE Take 10 mg by mouth  daily.   solifenacin 5 MG tablet Commonly known as: VESICARE Take 5 mg by mouth daily.   Toujeo SoloStar 300 UNIT/ML Sopn Generic drug: Insulin Glargine Inject 52 Units into the skin at bedtime.   traZODone 50 MG tablet Commonly known as: DESYREL Take 50 mg by mouth at bedtime.        Signed: Rajendra Spiller P 12/31/2019, 9:01 AM

## 2019-12-31 NOTE — Evaluation (Addendum)
Occupational Therapy Evaluation Patient Details Name: Bill Bowman MRN: [id number] DOB: [birth year] Today's Date: 12/31/2019    History of Present Illness Pt is a 69 y/o male who presents s/p sunthalamic stimulator insertion on 12/29/2019. PMH significant for Vertigo, Parkinson's Disease, HTN, DM, R TKR, hernia repair.    Clinical Impression   Patient is s/p see above surgery resulting in the deficits listed below (see OT Problem List). Patient reporting headache and did not want to complete much at this time. Patient completed bed mobility supine to sit with minguard to supervsion and sitting to sanding with min guard. Patient ambulated room distance with min guard to superversion for safety.   Patient will benefit from skilled OT to increase their safety and independence with ADL and functional mobility for ADL (while adhering to their precautions) to facilitate discharge to venue listed below.       Follow Up Recommendations  No OT follow up;Supervision/Assistance - 24 hour    Equipment Recommendations  None recommended by OT    Recommendations for Other Services       Precautions / Restrictions Precautions Precautions: Fall Restrictions Weight Bearing Restrictions: No      Mobility Bed Mobility Overal bed mobility: Needs Assistance Bed Mobility: Supine to Sit     Supine to sit: Supervision     General bed mobility comments: for safety  Transfers Overall transfer level: Needs assistance Equipment used: Rolling walker (2 wheeled) Transfers: Sit to/from Stand Sit to Stand: Min guard         General transfer comment: min guard for safety with transition, good technique utilized    Balance Overall balance assessment: Needs assistance Sitting-balance support: Feet supported;No upper extremity supported Sitting balance-Leahy Scale: Fair Sitting balance - Comments: posterior lean   Standing balance support: Single extremity supported;Bilateral upper  extremity supported;No upper extremity supported Standing balance-Leahy Scale: Poor                             ADL either performed or assessed with clinical judgement   ADL Overall ADL's : Needs assistance/impaired Eating/Feeding: Modified independent   Grooming: Wash/dry face   Upper Body Bathing: Modified independent   Lower Body Bathing: Min guard   Upper Body Dressing : Modified independent;Sitting   Lower Body Dressing: Minimal assistance;Sit to/from stand Lower Body Dressing Details (indicate cue type and reason): able to reach feet with hip flexion and crossing Toilet Transfer: Min guard;RW   Toileting- Clothing Manipulation and Hygiene: Min guard   Tub/ Shower Transfer: Minimal assistance;Tub bench;Cueing for safety;Cueing for sequencing   Functional mobility during ADLs: Min guard;Rolling walker General ADL Comments: heache so      Vision   Vision Assessment?: No apparent visual deficits(wears glasses)     Perception     Praxis      Pertinent Vitals/Pain Pain Assessment: Faces Pain Score: 4  Faces Pain Scale: Hurts even more Pain Location: headache Pain Descriptors / Indicators: Grimacing;Headache Pain Intervention(s): Monitored during session;Repositioned     Hand Dominance Right   Extremity/Trunk Assessment Upper Extremity Assessment Upper Extremity Assessment: LUE deficits/detail;RUE deficits/detail RUE Deficits / Details: tremor able to hold utensils LUE Deficits / Details: tremor able to hold utensils   Lower Extremity Assessment Lower Extremity Assessment: Defer to PT evaluation   Cervical / Trunk Assessment Cervical / Trunk Assessment: Other exceptions Cervical / Trunk Exceptions: forward head posture with rounded shoulders.    Communication Communication Communication: [facility]  Cognition Arousal/Alertness: Awake/alert Behavior During Therapy: Flat affect Overall Cognitive Status: Impaired/Different from baseline Area of  Impairment: Safety/judgement                   Current Attention Level: Focused Memory: Decreased short-term memory   Safety/Judgement: Decreased awareness of safety;Decreased awareness of deficits Awareness: Intellectual Problem Solving: Slow processing;Decreased initiation;Requires verbal cues;Requires tactile cues     General Comments       Exercises     Shoulder Instructions      Home Living Family/patient expects to be discharged to:: Private residence Living Arrangements: Spouse/significant other Available Help at Discharge: Family;Available 24 hours/day Type of Home: House Home Access: Stairs to enter [employer] of Steps: 8 Entrance Stairs-Rails: Right;Left Home Layout: Two level;Able to live on main level with bedroom/bathroom Alternate Level Stairs-Number of Steps: Flight   Bathroom Shower/Tub: Walk-in shower;Tub only   Bathroom Toilet: Standard Bathroom Accessibility: Yes How Accessible: Accessible via walker Home Equipment: Walker - 2 wheels;Cane - single point;Adaptive equipment Adaptive Equipment: Reacher        Prior Functioning/Environment Level of Independence: Independent with assistive device(s)        Comments: Mostly independent however daughter reports on days when tremors were "really bad" he would use the [facility] for support.         OT Problem List:        OT Treatment/Interventions:  Self care 8-22 mins    OT Goals(Current goals can be found in the care plan section) Acute Rehab OT Goals Patient Stated Goal: to eat some lunch OT Goal Formulation: With patient Time For Goal Achievement: 01/13/20 Potential to Achieve Goals: Good  OT Frequency: Min 2X/week   Barriers to D/C:            Co-evaluation              AM-PAC OT "6 Clicks" Daily Activity     Outcome Measure Help from another person eating meals?: None Help from another person taking care of personal grooming?: None Help from another person  toileting, which includes using toliet, bedpan, or urinal?: A Little Help from another person bathing (including washing, rinsing, drying)?: A Little Help from another person to put on and taking off regular upper body clothing?: None Help from another person to put on and taking off regular lower body clothing?: A Little 6 Click Score: 21   End of Session Equipment Utilized During Treatment: Gait belt;Rolling walker Nurse Communication: Mobility status;Precautions  Activity Tolerance: Patient tolerated treatment well Patient left: in bed;with call bell/phone within reach  OT Visit Diagnosis: Unsteadiness on feet (R26.81);Muscle weakness (generalized) (M62.81)                Time: [id number] OT Time Calculation (min): 14 min Charges:  OT General Charges $OT Visit: 1 Visit  Joeseph Amor OTR/L  Acute Rehab Services  [phone] office number [phone] pager number   Joeseph Amor 12/31/2019, 9:56 AM

## 2019-12-31 NOTE — Discharge Instructions (Signed)
Wound Care Leave incision open to air. You may shower. Do not scrub directly on incision.  Do not put any creams, lotions, or ointments on incision. Activity Walk each and every day, increasing distance each day. No lifting greater than 5 lbs.  Avoid bending, arching, and twisting. No driving for 2 weeks; may ride as a passenger locally. Diet Resume your normal diet.  Return to Work Will be discussed at you follow up appointment. Call Your Doctor If Any of These Occur Redness, drainage, or swelling at the wound.  Temperature greater than 101 degrees. Severe pain not relieved by pain medication. Incision starts to come apart. Follow Up Appt Call today for appointment in 2-4 weeks [id number]) or for problems.  If you have any hardware placed in your spine, you will need an x-ray before your appointment..  Discharge Instructions After Deep Brain Stimulation  What should I expect after surgery? You will not experience the full benefit of DBS until the devices are fully programmed. You may however have some temporary relief of your symptoms before your final programming due to brain swelling around the stimulator tip. This is expected and normal. As the swelling goes down, your pre-operative level of functioning will return. Do not be discouraged when this happens. It is normal. Remember that your stimulator has not been fully activated yet. When will my stimulator be turned on? Your internal pulse generator (IPG) will be activated by a physician or clinical nurse specialist (CNS) within a month after surgery. Sometimes, activation may occur before you are discharged from the hospital. For most patients, the benefits of a new stimulator setting will not be noticed for a few hours or days after the setting change. It is usually necessary to return to the clinic for adjustments after initial programming to achieve the best settings for control of your movement disorder symptoms. Will my  medication schedule change? After your discharge from the hospital, we recommend that you continue to take the same medications you did prior to surgery, unless instructed otherwise. Once your stimulator has been activated, you may need to adjust your movement disorder medications. If you have Parkinson's disease and experience an increase in drug-induced involuntary movements (dyskinesias) in the days or weeks after surgery, you should contact us at [phone] to discuss treatment options. (over) Is swelling & bruising normal after I've left the hospital? You may experience some swelling (facial, neck, or chest) and bruising near the surgical sites over the first week after surgery. Swelling around the eyes often occurs 2-3 days after surgery and may make it difficult to open your eyes. This should get better within the first few weeks. Contact your neurosurgeon at [phone] or your clinical nurse specialist at [phone], if you have any concerns about this issue. Care of the incision . Wounds heal best in a clean moist environment. . It is safe to shower 5 days after surgery with regular shampoo. Gently shampoo hair and incision to soften scabs and dead skin cells around the wound. Pat dry the incision and area around the incision with a clean towel. Do not use a blow dryer near the incision. Marland Kitchen Apply a thin layer of antibiotic ointment (i.e. Bacitracin over the counter) to the incision 1-2 times a day to keep the incision moist. The wound can be left open to air or you may place a small dressing to prevent ointment from getting on clothing. Use non-stick dressings and minimal adhesive tape around incision. . Small amounts  of bleeding may occur as the wound is healing, blot dry with a clean tissue and apply gentle pressure. . Sutures and staples can be removed 10-14 days after surgery by a primary care physician, nurse practitioner and/or registered nurse . Call the  neurosurgery clinic immediately if there is any drainage, pus, increased swelling, or redness at the incision Is it safe to have an MRI? Body MRIs should not be done after you have had DBS surgery. If a brain MRI is required, please have the radiologist or radiology tech contact us prior to having the MRI to determine if the MRI procedure can be performed safely.  Local Endocrinologists [facility] [phone]) 1. Dr. Philemon Kingdom 2. Dr. Janie [facility] [phone]) 1. Dr. Delrae Rend [facility] [phone]) 1. Dr. Jacelyn Pi 2. Dr. Anda Kraft [facility] [phone] 1. Dr. Daneil Dolin [facility] [phone]) [[place] office]  [phone]) [[place] office] 1. Dr. Lenna Sciara [place] 2. Dr. Mee Hives [facility]) [phone]) 1. Autumn Hudnall Ronnald Ramp), PA 2. Dr. Amalia Greenhouse 3. Dr. Marsh Dolly. [facility] [phone]) 1. Dr. Glade Lloyd [facility] [phone]) 1. Dr. Orville Govern 2. Dr. Lelon Huh 3. Dr. Jerelene Redden 4. Alwyn Ren, FNP Dr. Carolynn Serve. [facility] in [place] [phone])   Hemoglobin A1c Test Why am I having this test? You may have the hemoglobin A1c test (HbA1c test) done to:  Evaluate your risk for developing diabetes (diabetes mellitus).  Diagnose diabetes.  Monitor long-term control of blood sugar (glucose) in people who have diabetes and help make treatment decisions. This test may be done with other blood glucose tests, such as fasting blood glucose and oral glucose tolerance tests. What is being tested? Hemoglobin is a type of protein in the blood that carries oxygen. Glucose attaches to hemoglobin to form glycated hemoglobin. This test checks the amount of glycated hemoglobin in your blood, which is a good indicator of the average  amount of glucose in your blood during the past 2-3 months. What kind of sample is taken?  A blood sample is required for this test. It is usually collected by inserting a needle into a blood vessel. Tell a health care provider about:  All medicines you are taking, including vitamins, herbs, eye drops, creams, and over-the-counter medicines.  Any blood disorders you have.  Any surgeries you have had.  Any medical conditions you have.  Whether you are pregnant or may be pregnant. How are the results reported? Your results will be reported as a percentage that indicates how much of your hemoglobin has glucose attached to it (is glycated). Your health care provider will compare your results to normal ranges that were established after testing a large group of people (reference ranges). Reference ranges may vary among labs and hospitals. For this test, common reference ranges are:  Adult or child without diabetes: 4-5.6%.  Adult or child with diabetes and good blood glucose control: less than 7%. What do the results mean? If you have diabetes:  A result of less than 7% is considered normal, meaning that your blood glucose is well controlled.  A result higher than 7% means that your blood glucose is not well controlled, and your treatment plan may need to be adjusted. If you do not have diabetes:  A result within the reference range is considered normal, meaning that you are not at high risk for diabetes.  A result of 5.7-6.4% means  that you have a high risk of developing diabetes, and you may have prediabetes. Prediabetes is the condition of having a blood glucose level that is higher than it should be, but not high enough for you to be diagnosed with diabetes. Having prediabetes puts you at risk for developing type 2 diabetes (type 2 diabetes mellitus). You may have more tests, including a repeat HbA1c test.  Results of 6.5% or higher on two separate HbA1c tests mean that you have  diabetes. You may have more tests to confirm the diagnosis. Abnormally low HbA1c values may be caused by:  Pregnancy.  Severe blood loss.  Receiving donated blood (transfusions).  Low red blood cell count (anemia).  Long-term kidney failure.  Some unusual forms (variants) of hemoglobin. Talk with your health care provider about what your results mean. Questions to ask your health care provider Ask your health care provider, or the department that is doing the test:  When will my results be ready?  How will I get my results?  What are my treatment options?  What other tests do I need?  What are my next steps? Summary  The hemoglobin A1c test (HbA1c test) may be done to evaluate your risk for developing diabetes, to diagnose diabetes, and to monitor long-term control of blood sugar (glucose) in people who have diabetes and help make treatment decisions.  Hemoglobin is a type of protein in the blood that carries oxygen. Glucose attaches to hemoglobin to form glycated hemoglobin. This test checks the amount of glycated hemoglobin in your blood, which is a good indicator of the average amount of glucose in your blood during the past 2-3 months.  Talk with your health care provider about what your results mean. This information is not intended to replace advice given to you by your health care provider. Make sure you discuss any questions you have with your health care provider. Document Revised: 11/06/2017 Document Reviewed: 07/07/2017 Elsevier Patient Education  [place].  Hyperglycemia Hyperglycemia occurs when the level of sugar (glucose) in the blood is too high. Glucose is a type of sugar that provides the body's main source of energy. Certain hormones (insulin and glucagon) control the level of glucose in the blood. Insulin lowers blood glucose, and glucagon increases blood glucose. Hyperglycemia can result from having too little insulin in the bloodstream, or from  the body not responding normally to insulin. Hyperglycemia occurs most often in people who have diabetes (diabetes mellitus), but it can happen in people who do not have diabetes. It can develop quickly, and it can be life-threatening if it causes you to become severely dehydrated (diabetic ketoacidosis or hyperglycemic hyperosmolar state). Severe hyperglycemia is a medical emergency. What are the causes? If you have diabetes, hyperglycemia may be caused by:  Diabetes medicine.  Medicines that increase blood glucose or affect your diabetes control.  Not eating enough, or not eating often enough.  Changes in physical activity level.  Being sick or having an infection. If you have prediabetes or undiagnosed diabetes:  Hyperglycemia may be caused by those conditions. If you do not have diabetes, hyperglycemia may be caused by:  Certain medicines, including steroid medicines, beta-blockers, epinephrine, and thiazide diuretics.  Stress.  Serious illness.  Surgery.  Diseases of the pancreas.  Infection. What increases the risk? Hyperglycemia is more likely to develop in people who have risk factors for diabetes, such as:  Having a family member with diabetes.  Having a gene for type 1 diabetes that is  passed from parent to child (inherited).  Living in an area with cold weather conditions.  Exposure to certain viruses.  Certain conditions in which the body's disease-fighting (immune) system attacks itself (autoimmune disorders).  Being overweight or obese.  Having an inactive (sedentary) lifestyle.  Having been diagnosed with insulin resistance.  Having a history of prediabetes, gestational diabetes, or polycystic ovarian syndrome (PCOS).  Being of American-Indian, African-American, Hispanic/Latino, or Asian/Pacific Islander descent. What are the signs or symptoms? Hyperglycemia may not cause any symptoms. If you do have symptoms, they may include early warning signs,  such as:  Increased thirst.  Hunger.  Feeling very tired.  Needing to urinate more often than usual.  Blurry vision. Other symptoms may develop if hyperglycemia gets worse, such as:  Dry mouth.  Loss of appetite.  Fruity-smelling breath.  Weakness.  Unexpected or rapid weight gain or weight loss.  Tingling or numbness in the hands or feet.  Headache.  Skin that does not quickly return to normal after being lightly pinched and released (poor skin turgor).  Abdominal pain.  Cuts or bruises that are slow to heal. How is this diagnosed? Hyperglycemia is diagnosed with a blood test to measure your blood glucose level. This blood test is usually done while you are having symptoms. Your health care provider may also do a physical exam and review your medical history. You may have more tests to determine the cause of your hyperglycemia, such as:  A fasting blood glucose (FBG) test. You will not be allowed to eat (you will fast) for at least 8 hours before a blood sample is taken.  An A1c (hemoglobin A1c) blood test. This provides information about blood glucose control over the previous 2-3 months.  An oral glucose tolerance test (OGTT). This measures your blood glucose at two times: ? After fasting. This is your baseline blood glucose level. ? Two hours after drinking a beverage that contains glucose. How is this treated? Treatment depends on the cause of your hyperglycemia. Treatment may include:  Taking medicine to regulate your blood glucose levels. If you take insulin or other diabetes medicines, your medicine or dosage may be adjusted.  Lifestyle changes, such as exercising more, eating healthier foods, or losing weight.  Treating an illness or infection, if this caused your hyperglycemia.  Checking your blood glucose more often.  Stopping or reducing steroid medicines, if these caused your hyperglycemia. If your hyperglycemia becomes severe and it results in  hyperglycemic hyperosmolar state, you must be hospitalized and given IV fluids. Follow these instructions at home:  General instructions  Take over-the-counter and prescription medicines only as told by your health care provider.  Do not use any products that contain nicotine or tobacco, such as cigarettes and e-cigarettes. If you need help quitting, ask your health care provider.  Limit alcohol intake to no more than 1 drink per day for nonpregnant women and 2 drinks per day for men. One drink equals 12 oz of beer, 5 oz of wine, or 1 oz of hard liquor.  Learn to manage stress. If you need help with this, ask your health care provider.  Keep all follow-up visits as told by your health care provider. This is important. Eating and drinking   Maintain a healthy weight.  Exercise regularly, as directed by your health care provider.  Stay hydrated, especially when you exercise, get sick, or spend time in hot temperatures.  Eat healthy foods, such as: ? Lean proteins. ? Complex carbohydrates. ? Fresh  fruits and vegetables. ? Low-fat dairy products. ? Healthy fats.  Drink enough fluid to keep your urine clear or pale yellow. If you have diabetes:  Make sure you know the symptoms of hyperglycemia.  Follow your diabetes management plan, as told by your health care provider. Make sure you: ? Take your insulin and medicines as directed. ? Follow your exercise plan. ? Follow your meal plan. Eat on time, and do not skip meals. ? Check your blood glucose as often as directed. Make sure to check your blood glucose before and after exercise. If you exercise longer or in a different way than usual, check your blood glucose more often. ? Follow your sick day plan whenever you cannot eat or drink normally. Make this plan in advance with your health care provider.  Share your diabetes management plan with people in your workplace, school, and household.  Check your urine for ketones when you  are ill and as told by your health care provider.  Carry a medical alert card or wear medical alert jewelry. Contact a health care provider if:  Your blood glucose is at or above 240 mg/dL (13.3 mmol/L) for 2 days in a row.  You have problems keeping your blood glucose in your target range.  You have frequent episodes of hyperglycemia. Get help right away if:  You have difficulty breathing.  You have a change in how you think, feel, or act (mental status).  You have nausea or vomiting that does not go away. These symptoms may represent a serious problem that is an emergency. Do not wait to see if the symptoms will go away. Get medical help right away. Call your local emergency services (911 in the [place] Do not drive yourself to the hospital. Summary  Hyperglycemia occurs when the level of sugar (glucose) in the blood is too high.  Hyperglycemia is diagnosed with a blood test to measure your blood glucose level. This blood test is usually done while you are having symptoms. Your health care provider may also do a physical exam and review your medical history.  If you have diabetes, follow your diabetes management plan as told by your health care provider.  Contact your health care provider if you have problems keeping your blood glucose in your target range. This information is not intended to replace advice given to you by your health care provider. Make sure you discuss any questions you have with your health care provider. Document Revised: 08/11/2016 Document Reviewed: 08/11/2016 Elsevier Patient Education  [place].

## 2019-12-31 NOTE — Progress Notes (Signed)
Patient is discharged from room 3C02 at this time. Alert and in stable condition. IV site d/c'd and instructions read to patient and spouse with understanding verbalized. Left unit via wheelchair with all belongings at side 

## 2020-01-03 ENCOUNTER — Other Ambulatory Visit: Payer: Self-pay

## 2020-01-03 ENCOUNTER — Other Ambulatory Visit (HOSPITAL_COMMUNITY)
Admission: RE | Admit: 2020-01-03 | Discharge: 2020-01-03 | Disposition: A | Discharge status: Home / Self Care | Payer: Medicare Other | Source: Ambulatory Visit | Attending: Neurosurgery | Admitting: Neurosurgery

## 2020-01-03 ENCOUNTER — Encounter (HOSPITAL_COMMUNITY): Payer: Self-pay | Admitting: Neurosurgery

## 2020-01-03 DIAGNOSIS — Z01812 Encounter for preprocedural laboratory examination: Secondary | ICD-10-CM | POA: Diagnosis present

## 2020-01-03 DIAGNOSIS — Z20822 Contact with and (suspected) exposure to covid-19: Secondary | ICD-10-CM | POA: Insufficient documentation

## 2020-01-03 NOTE — Progress Notes (Signed)
SDW-Pre-op call completed by pt spouse, Hilda Blades (DPR). Spouse denies that pt C/O SOB and chest pain. Spouse denies that pt is under the care of a cardiologist. Spouse stated that pt sees [name], Neurology and Dr. Gillie Manners, PCP. Spouse denies that pt had a cardiac cath. Spouse denies that pt had an EKG in the last year. Spouse stated that pt was instructed to stop blood thinners. Spouse made aware to have pt stop taking  vitamins, fish oil and herbal medications. Do not take any NSAIDs ie: Ibuprofen, Advil, Naproxen (Aleve), Motrin, Meloxicam ( Mobic) BC and Goody Powder. Spouse made aware to have pt take 50 % of Toujeo insulin the night before surgery  (26 units). Spouse made aware to have pt hold Metformin and Exenatide on DOS. Spouse made aware to have pt check CBG every 2 hours prior to arrival to hospital on DOS. Spouse made aware to have pt treat a CBG < 70 with 4 glucose tabs, wait 15 minutes after intervention to recheck CBG, if CBG remains < 70, call Short Stay unit to speak with a nurse. Spouse reminded to have pt quarantine. Spouse verbalized understanding of all pre-op instructions.

## 2020-01-04 LAB — SARS CORONAVIRUS 2 (TAT 6-24 HRS): SARS Coronavirus 2: NEGATIVE

## 2020-01-04 NOTE — Anesthesia Preprocedure Evaluation (Addendum)
Anesthesia Evaluation  Patient identified by MRN, date of birth, ID band Patient awake    Reviewed: Allergy & Precautions, H&P , NPO status , Patient's Chart, lab work & pertinent test results  Airway Mallampati: II  TM Distance: >3 FB Neck ROM: Full    Dental  (+) Teeth Intact, Dental Advisory Given, Chipped   Pulmonary neg pulmonary ROS, former smoker,    Pulmonary exam normal breath sounds clear to auscultation       Cardiovascular hypertension, Pt. on medications  Rhythm:Regular Rate:Normal     Neuro/Psych  Headaches, Anxiety Depression  Neuromuscular disease    GI/Hepatic Neg liver ROS, GERD  Medicated,  Endo/Other  diabetes, Type 2, Oral Hypoglycemic Agents  Renal/GU   negative genitourinary   Musculoskeletal  (+) Arthritis , Osteoarthritis,    Abdominal (+) + obese,   Peds  Hematology negative hematology ROS (+)   Anesthesia Other Findings   Reproductive/Obstetrics negative OB ROS                            Anesthesia Physical  Anesthesia Plan  ASA: III  Anesthesia Plan: General   Post-op Pain Management:    Induction: Intravenous  PONV Risk Score and Plan: 3 and Treatment may vary due to age or medical condition, Ondansetron and Dexamethasone  Airway Management Planned: LMA  Additional Equipment: None  Intra-op Plan:   Post-operative Plan: Extubation in OR  Informed Consent: I have reviewed the patients History and Physical, chart, labs and discussed the procedure including the risks, benefits and alternatives for the proposed anesthesia with the patient or authorized representative who has indicated his/her understanding and acceptance.     Dental advisory given  Plan Discussed with: CRNA  Anesthesia Plan Comments:        Anesthesia Quick Evaluation

## 2020-01-05 ENCOUNTER — Observation Stay (HOSPITAL_COMMUNITY)
Admission: RE | Admit: 2020-01-05 | Discharge: 2020-01-05 | Disposition: A | Discharge status: Home / Self Care | Payer: Medicare Other | Attending: Neurosurgery | Admitting: Neurosurgery

## 2020-01-05 ENCOUNTER — Inpatient Hospital Stay (HOSPITAL_COMMUNITY): Payer: Medicare Other | Admitting: Anesthesiology

## 2020-01-05 ENCOUNTER — Encounter (HOSPITAL_COMMUNITY)
Admission: RE | Disposition: A | Discharge status: Home / Self Care | Payer: Self-pay | Source: Home / Self Care | Attending: Neurosurgery

## 2020-01-05 ENCOUNTER — Encounter (HOSPITAL_COMMUNITY): Payer: Self-pay | Admitting: Neurosurgery

## 2020-01-05 ENCOUNTER — Other Ambulatory Visit: Payer: Self-pay

## 2020-01-05 DIAGNOSIS — Z96612 Presence of left artificial shoulder joint: Secondary | ICD-10-CM | POA: Diagnosis not present

## 2020-01-05 DIAGNOSIS — Z79899 Other long term (current) drug therapy: Secondary | ICD-10-CM | POA: Diagnosis not present

## 2020-01-05 DIAGNOSIS — E119 Type 2 diabetes mellitus without complications: Secondary | ICD-10-CM | POA: Insufficient documentation

## 2020-01-05 DIAGNOSIS — I10 Essential (primary) hypertension: Secondary | ICD-10-CM | POA: Insufficient documentation

## 2020-01-05 DIAGNOSIS — F329 Major depressive disorder, single episode, unspecified: Secondary | ICD-10-CM | POA: Insufficient documentation

## 2020-01-05 DIAGNOSIS — Z791 Long term (current) use of non-steroidal anti-inflammatories (NSAID): Secondary | ICD-10-CM | POA: Insufficient documentation

## 2020-01-05 DIAGNOSIS — K219 Gastro-esophageal reflux disease without esophagitis: Secondary | ICD-10-CM | POA: Diagnosis not present

## 2020-01-05 DIAGNOSIS — G20A1 Parkinson's disease without dyskinesia, without mention of fluctuations: Secondary | ICD-10-CM | POA: Diagnosis present

## 2020-01-05 DIAGNOSIS — F419 Anxiety disorder, unspecified: Secondary | ICD-10-CM | POA: Insufficient documentation

## 2020-01-05 DIAGNOSIS — Z87891 Personal history of nicotine dependence: Secondary | ICD-10-CM | POA: Insufficient documentation

## 2020-01-05 DIAGNOSIS — G20 Parkinson's disease: Secondary | ICD-10-CM | POA: Diagnosis present

## 2020-01-05 DIAGNOSIS — Z794 Long term (current) use of insulin: Secondary | ICD-10-CM | POA: Diagnosis not present

## 2020-01-05 HISTORY — PX: PULSE GENERATOR IMPLANT: SHX5370

## 2020-01-05 LAB — GLUCOSE, CAPILLARY
Glucose-Capillary: 154 mg/dL — ABNORMAL HIGH (ref 70–99)
Glucose-Capillary: 152 mg/dL — ABNORMAL HIGH (ref 70–99)
Glucose-Capillary: 210 mg/dL — ABNORMAL HIGH (ref 70–99)

## 2020-01-05 SURGERY — UNILATERAL PULSE GENERATOR IMPLANT
Anesthesia: General | Laterality: Right

## 2020-01-05 MED ORDER — METFORMIN HCL 500 MG PO TABS: 1000.0000 mg | ORAL_TABLET | Freq: Two times a day (BID) | ORAL | Status: DC

## 2020-01-05 MED ORDER — LIDOCAINE 2% (20 MG/ML) 5 ML SYRINGE
INTRAMUSCULAR | Status: DC | PRN
  Administered 2020-01-05 (×2): 100 mg via INTRAVENOUS

## 2020-01-05 MED ORDER — LACTATED RINGERS IV SOLN: INTRAVENOUS | Status: DC | PRN

## 2020-01-05 MED ORDER — CHLORHEXIDINE GLUCONATE CLOTH 2 % EX PADS: 6.0000 | MEDICATED_PAD | Freq: Once | CUTANEOUS | Status: DC

## 2020-01-05 MED ORDER — SODIUM CHLORIDE 0.9% FLUSH: 3.0000 mL | INTRAVENOUS | Status: DC | PRN

## 2020-01-05 MED ORDER — VANCOMYCIN HCL IN DEXTROSE 1-5 GM/200ML-% IV SOLN
INTRAVENOUS | Status: AC
  Filled 2020-01-05: qty 200

## 2020-01-05 MED ORDER — DEXAMETHASONE SODIUM PHOSPHATE 10 MG/ML IJ SOLN
INTRAMUSCULAR | Status: DC | PRN
  Administered 2020-01-05: 10 mg via INTRAVENOUS

## 2020-01-05 MED ORDER — OXYCODONE HCL 5 MG PO TABS: 5.0000 mg | ORAL_TABLET | Freq: Once | ORAL | Status: DC | PRN

## 2020-01-05 MED ORDER — OXYCODONE HCL 5 MG PO TABS: 5.0000 mg | ORAL_TABLET | Freq: Four times a day (QID) | ORAL | Status: DC | PRN

## 2020-01-05 MED ORDER — OXYCODONE HCL 5 MG PO TABS: 5.0000 mg | ORAL_TABLET | ORAL | Status: DC | PRN

## 2020-01-05 MED ORDER — PROPOFOL 10 MG/ML IV BOLUS
INTRAVENOUS | Status: AC
  Filled 2020-01-05: qty 20

## 2020-01-05 MED ORDER — OXYCODONE-ACETAMINOPHEN 5-325 MG PO TABS: 1.0000 | ORAL_TABLET | Freq: Four times a day (QID) | ORAL | Status: DC | PRN

## 2020-01-05 MED ORDER — FENTANYL CITRATE (PF) 100 MCG/2ML IJ SOLN: 25.0000 ug | INTRAMUSCULAR | Status: DC | PRN

## 2020-01-05 MED ORDER — ACETAMINOPHEN 650 MG RE SUPP: 650.0000 mg | RECTAL | Status: DC | PRN

## 2020-01-05 MED ORDER — PRAMIPEXOLE DIHYDROCHLORIDE 0.25 MG PO TABS
0.5000 mg | ORAL_TABLET | Freq: Three times a day (TID) | ORAL | Status: DC
  Administered 2020-01-05: 12:00:00 0.5 mg via ORAL
  Filled 2020-01-05: qty 2

## 2020-01-05 MED ORDER — PHENYLEPHRINE 40 MCG/ML (10ML) SYRINGE FOR IV PUSH (FOR BLOOD PRESSURE SUPPORT)
PREFILLED_SYRINGE | INTRAVENOUS | Status: DC | PRN
  Administered 2020-01-05 (×2): 120 ug via INTRAVENOUS
  Administered 2020-01-05: 80 ug via INTRAVENOUS
  Administered 2020-01-05 (×2): 120 ug via INTRAVENOUS
  Administered 2020-01-05: 80 ug via INTRAVENOUS

## 2020-01-05 MED ORDER — CEPHALEXIN 250 MG PO CAPS: 250.0000 mg | ORAL_CAPSULE | Freq: Four times a day (QID) | ORAL | Status: DC

## 2020-01-05 MED ORDER — VANCOMYCIN HCL 1 G IV SOLR
INTRAVENOUS | Status: DC | PRN
  Administered 2020-01-05: 1000 mg

## 2020-01-05 MED ORDER — ONDANSETRON HCL 4 MG/2ML IJ SOLN
INTRAMUSCULAR | Status: DC | PRN
  Administered 2020-01-05: 4 mg via INTRAVENOUS

## 2020-01-05 MED ORDER — MIDAZOLAM HCL 2 MG/2ML IJ SOLN
INTRAMUSCULAR | Status: AC
  Filled 2020-01-05: qty 2

## 2020-01-05 MED ORDER — ONDANSETRON HCL 4 MG/2ML IJ SOLN: 4.0000 mg | Freq: Once | INTRAMUSCULAR | Status: DC | PRN

## 2020-01-05 MED ORDER — OXYCODONE HCL 5 MG/5ML PO SOLN: 5.0000 mg | Freq: Once | ORAL | Status: DC | PRN

## 2020-01-05 MED ORDER — OXYCODONE-ACETAMINOPHEN 10-325 MG PO TABS: 1.0000 | ORAL_TABLET | Freq: Four times a day (QID) | ORAL | Status: DC | PRN

## 2020-01-05 MED ORDER — INSULIN ASPART 100 UNIT/ML ~~LOC~~ SOLN: 0.0000 [IU] | Freq: Every day | SUBCUTANEOUS | Status: DC

## 2020-01-05 MED ORDER — OXYCODONE HCL 5 MG PO TABS: 10.0000 mg | ORAL_TABLET | ORAL | Status: DC | PRN

## 2020-01-05 MED ORDER — 0.9 % SODIUM CHLORIDE (POUR BTL) OPTIME
TOPICAL | Status: DC | PRN
  Administered 2020-01-05: 1000 mL

## 2020-01-05 MED ORDER — ALUM & MAG HYDROXIDE-SIMETH 200-200-20 MG/5ML PO SUSP: 30.0000 mL | Freq: Four times a day (QID) | ORAL | Status: DC | PRN

## 2020-01-05 MED ORDER — BUPIVACAINE HCL (PF) 0.5 % IJ SOLN
INTRAMUSCULAR | Status: AC
  Filled 2020-01-05: qty 30

## 2020-01-05 MED ORDER — FLEET ENEMA 7-19 GM/118ML RE ENEM: 1.0000 | ENEMA | Freq: Once | RECTAL | Status: DC | PRN

## 2020-01-05 MED ORDER — RAMIPRIL 10 MG PO CAPS
10.0000 mg | ORAL_CAPSULE | Freq: Every day | ORAL | Status: DC
  Administered 2020-01-05: 10 mg via ORAL
  Filled 2020-01-05: qty 1

## 2020-01-05 MED ORDER — TRAZODONE HCL 50 MG PO TABS
50.0000 mg | ORAL_TABLET | Freq: Every day | ORAL | Status: DC
  Filled 2020-01-05: qty 1

## 2020-01-05 MED ORDER — VANCOMYCIN HCL 1000 MG IV SOLR
INTRAVENOUS | Status: AC
  Filled 2020-01-05: qty 1000

## 2020-01-05 MED ORDER — VANCOMYCIN HCL 1000 MG IV SOLR
INTRAVENOUS | Status: DC | PRN
  Administered 2020-01-05: 08:00:00 1000 mg via INTRAVENOUS

## 2020-01-05 MED ORDER — SODIUM CHLORIDE 0.9% FLUSH: 3.0000 mL | Freq: Two times a day (BID) | INTRAVENOUS | Status: DC

## 2020-01-05 MED ORDER — DOCUSATE SODIUM 100 MG PO CAPS
100.0000 mg | ORAL_CAPSULE | Freq: Two times a day (BID) | ORAL | Status: DC
  Administered 2020-01-05: 12:00:00 100 mg via ORAL
  Filled 2020-01-05: qty 1

## 2020-01-05 MED ORDER — ACETAMINOPHEN 325 MG PO TABS
650.0000 mg | ORAL_TABLET | ORAL | Status: DC | PRN
  Administered 2020-01-05: 12:00:00 650 mg via ORAL
  Filled 2020-01-05: qty 2

## 2020-01-05 MED ORDER — MENTHOL 3 MG MT LOZG: 1.0000 | LOZENGE | OROMUCOSAL | Status: DC | PRN

## 2020-01-05 MED ORDER — INSULIN ASPART 100 UNIT/ML ~~LOC~~ SOLN
0.0000 [IU] | Freq: Three times a day (TID) | SUBCUTANEOUS | Status: DC
  Administered 2020-01-05: 5 [IU] via SUBCUTANEOUS

## 2020-01-05 MED ORDER — ASPIRIN EC 81 MG PO TBEC: 81.0000 mg | DELAYED_RELEASE_TABLET | Freq: Every day | ORAL | Status: DC

## 2020-01-05 MED ORDER — EXENATIDE ER 2 MG/0.85ML ~~LOC~~ AUIJ: 2.0000 mg | AUTO-INJECTOR | SUBCUTANEOUS | Status: DC

## 2020-01-05 MED ORDER — ONDANSETRON HCL 4 MG/2ML IJ SOLN
INTRAMUSCULAR | Status: AC
  Filled 2020-01-05: qty 2

## 2020-01-05 MED ORDER — DARIFENACIN HYDROBROMIDE ER 7.5 MG PO TB24
7.5000 mg | ORAL_TABLET | Freq: Every day | ORAL | Status: DC
  Administered 2020-01-05: 12:00:00 7.5 mg via ORAL
  Filled 2020-01-05: qty 1

## 2020-01-05 MED ORDER — MELOXICAM 7.5 MG PO TABS: 15.0000 mg | ORAL_TABLET | Freq: Every day | ORAL | Status: DC

## 2020-01-05 MED ORDER — PHENOL 1.4 % MT LIQD: 1.0000 | OROMUCOSAL | Status: DC | PRN

## 2020-01-05 MED ORDER — FENTANYL CITRATE (PF) 250 MCG/5ML IJ SOLN
INTRAMUSCULAR | Status: AC
  Filled 2020-01-05: qty 5

## 2020-01-05 MED ORDER — BUPIVACAINE HCL (PF) 0.5 % IJ SOLN
INTRAMUSCULAR | Status: DC | PRN
  Administered 2020-01-05: 20 mL

## 2020-01-05 MED ORDER — INSULIN ASPART 100 UNIT/ML ~~LOC~~ SOLN
4.0000 [IU] | Freq: Three times a day (TID) | SUBCUTANEOUS | Status: DC
  Administered 2020-01-05: 12:00:00 4 [IU] via SUBCUTANEOUS

## 2020-01-05 MED ORDER — INSULIN GLARGINE 100 UNIT/ML ~~LOC~~ SOLN
52.0000 [IU] | Freq: Every day | SUBCUTANEOUS | Status: DC
  Filled 2020-01-05: qty 0.52

## 2020-01-05 MED ORDER — HYDROMORPHONE HCL 1 MG/ML IJ SOLN: 0.5000 mg | INTRAMUSCULAR | Status: DC | PRN

## 2020-01-05 MED ORDER — CARBIDOPA-LEVODOPA 25-100 MG PO TABS
1.0000 | ORAL_TABLET | Freq: Three times a day (TID) | ORAL | Status: DC
  Administered 2020-01-05: 12:00:00 1 via ORAL
  Filled 2020-01-05 (×3): qty 1

## 2020-01-05 MED ORDER — BACITRACIN ZINC 500 UNIT/GM EX OINT
TOPICAL_OINTMENT | CUTANEOUS | Status: AC
  Filled 2020-01-05: qty 28.35

## 2020-01-05 MED ORDER — LIDOCAINE 2% (20 MG/ML) 5 ML SYRINGE
INTRAMUSCULAR | Status: AC
  Filled 2020-01-05: qty 5

## 2020-01-05 MED ORDER — FENTANYL CITRATE (PF) 100 MCG/2ML IJ SOLN
INTRAMUSCULAR | Status: DC | PRN
  Administered 2020-01-05 (×2): 50 ug via INTRAVENOUS
  Administered 2020-01-05: 100 ug via INTRAVENOUS

## 2020-01-05 MED ORDER — MIDAZOLAM HCL 5 MG/5ML IJ SOLN
INTRAMUSCULAR | Status: DC | PRN
  Administered 2020-01-05 (×2): 1 mg via INTRAVENOUS

## 2020-01-05 MED ORDER — CEFAZOLIN SODIUM-DEXTROSE 2-4 GM/100ML-% IV SOLN
2.0000 g | INTRAVENOUS | Status: AC
  Administered 2020-01-05: 08:00:00 2 g via INTRAVENOUS
  Filled 2020-01-05: qty 100

## 2020-01-05 MED ORDER — BISACODYL 10 MG RE SUPP: 10.0000 mg | Freq: Every day | RECTAL | Status: DC | PRN

## 2020-01-05 MED ORDER — MEPERIDINE HCL 25 MG/ML IJ SOLN: 6.2500 mg | INTRAMUSCULAR | Status: DC | PRN

## 2020-01-05 MED ORDER — POLYETHYLENE GLYCOL 3350 17 G PO PACK: 17.0000 g | PACK | Freq: Every day | ORAL | Status: DC | PRN

## 2020-01-05 MED ORDER — SODIUM CHLORIDE 0.9 % IV SOLN: 250.0000 mL | INTRAVENOUS | Status: DC

## 2020-01-05 MED ORDER — ONDANSETRON HCL 4 MG PO TABS: 4.0000 mg | ORAL_TABLET | Freq: Four times a day (QID) | ORAL | Status: DC | PRN

## 2020-01-05 MED ORDER — PROPOFOL 10 MG/ML IV BOLUS
INTRAVENOUS | Status: DC | PRN
  Administered 2020-01-05 (×2): 100 mg via INTRAVENOUS
  Administered 2020-01-05: 200 mg via INTRAVENOUS

## 2020-01-05 MED ORDER — LIDOCAINE-EPINEPHRINE 1 %-1:100000 IJ SOLN
INTRAMUSCULAR | Status: DC | PRN
  Administered 2020-01-05: 20 mL

## 2020-01-05 MED ORDER — CEFAZOLIN SODIUM-DEXTROSE 2-4 GM/100ML-% IV SOLN
2.0000 g | Freq: Three times a day (TID) | INTRAVENOUS | Status: DC
  Administered 2020-01-05: 2 g via INTRAVENOUS
  Filled 2020-01-05: qty 100

## 2020-01-05 MED ORDER — PAROXETINE HCL 30 MG PO TABS
30.0000 mg | ORAL_TABLET | Freq: Every day | ORAL | Status: DC
  Administered 2020-01-05: 12:00:00 30 mg via ORAL
  Filled 2020-01-05: qty 1

## 2020-01-05 MED ORDER — ONDANSETRON HCL 4 MG/2ML IJ SOLN: 4.0000 mg | Freq: Four times a day (QID) | INTRAMUSCULAR | Status: DC | PRN

## 2020-01-05 MED ORDER — KCL IN DEXTROSE-NACL 20-5-0.45 MEQ/L-%-% IV SOLN: INTRAVENOUS | Status: DC

## 2020-01-05 MED ORDER — LIDOCAINE-EPINEPHRINE 1 %-1:100000 IJ SOLN
INTRAMUSCULAR | Status: AC
  Filled 2020-01-05: qty 1

## 2020-01-05 SURGICAL SUPPLY — 43 items
BLADE CLIPPER SURG (BLADE) IMPLANT
BNDG ADH 1X3 SHEER STRL LF (GAUZE/BANDAGES/DRESSINGS) IMPLANT
BOOT SUTURE AID YELLOW STND (SUTURE) IMPLANT
CANISTER SUCT 3000ML PPV (MISCELLANEOUS) ×2 IMPLANT
CHARGING SYSTEM VERCISE ×2 IMPLANT
CLIP RANEY DISP (INSTRUMENTS) IMPLANT
COVER BACK TABLE 60X90IN (DRAPES) IMPLANT
COVER WAND RF STERILE (DRAPES) ×2 IMPLANT
DECANTER SPIKE VIAL GLASS SM (MISCELLANEOUS) ×2 IMPLANT
DERMABOND ADVANCED (GAUZE/BANDAGES/DRESSINGS) ×1
DERMABOND ADVANCED .7 DNX12 (GAUZE/BANDAGES/DRESSINGS) ×1 IMPLANT
DRAPE C-ARM 42X72 X-RAY (DRAPES) IMPLANT
DRAPE CAMERA CLOSED 9X96 (DRAPES) ×2 IMPLANT
DRAPE ORTHO SPLIT 77X108 STRL (DRAPES) ×1
DRAPE SURG ORHT 6 SPLT 77X108 (DRAPES) ×1 IMPLANT
DRSG OPSITE POSTOP 3X4 (GAUZE/BANDAGES/DRESSINGS) ×4 IMPLANT
DRSG TELFA 3X8 NADH (GAUZE/BANDAGES/DRESSINGS) ×2 IMPLANT
DURAPREP 26ML APPLICATOR (WOUND CARE) ×2 IMPLANT
GAUZE 4X4 16PLY RFD (DISPOSABLE) ×2 IMPLANT
GAUZE SPONGE 4X4 12PLY STRL (GAUZE/BANDAGES/DRESSINGS) IMPLANT
GLOVE BIO SURGEON STRL SZ8 (GLOVE) ×2 IMPLANT
GLOVE ECLIPSE 8.0 STRL XLNG CF (GLOVE) ×2 IMPLANT
GLOVE INDICATOR 8.0 STRL GRN (GLOVE) ×2 IMPLANT
GLOVE INDICATOR 8.5 STRL (GLOVE) ×2 IMPLANT
KIT BASIN OR (CUSTOM PROCEDURE TRAY) ×2 IMPLANT
KIT CONTACT EXTENSION 55CMX8 (Miscellaneous) ×2 IMPLANT
KIT IMPLANT PULSE GENERATOR (Generator) ×2 IMPLANT
KIT REMOTE CONTROL 3 VERCISE (KITS) ×2 IMPLANT
KIT REMOVER STAPLE SKIN (MISCELLANEOUS) ×2 IMPLANT
MARKER SKIN DUAL TIP RULER LAB (MISCELLANEOUS) ×2 IMPLANT
NEEDLE HYPO 25X1 1.5 SAFETY (NEEDLE) ×2 IMPLANT
NEEDLE SPNL 18GX3.5 QUINCKE PK (NEEDLE) IMPLANT
PACK LAMINECTOMY NEURO (CUSTOM PROCEDURE TRAY) ×2 IMPLANT
PAD ARMBOARD 7.5X6 YLW CONV (MISCELLANEOUS) IMPLANT
PASSER CATH 36 CODMAN DISP (NEUROSURGERY SUPPLIES) ×2 IMPLANT
STAPLER SKIN PROX WIDE 3.9 (STAPLE) ×2 IMPLANT
SUT ETHILON 2 0 FS 18 (SUTURE) ×2 IMPLANT
SUT ETHILON 3 0 PS 1 (SUTURE) IMPLANT
SUT SILK 2 0 PERMA HAND 18 BK (SUTURE) ×2 IMPLANT
SUT SILK 2 0 TIES 10X30 (SUTURE) ×2 IMPLANT
SUT VIC AB 2-0 CP2 18 (SUTURE) ×2 IMPLANT
SUT VIC AB 2-0 CT2 18 VCP726D (SUTURE) IMPLANT
SUT VIC AB 3-0 SH 8-18 (SUTURE) ×2 IMPLANT

## 2020-01-05 NOTE — Transfer of Care (Signed)
Immediate Anesthesia Transfer of Care Note  Patient: Bill Bowman  Procedure(s) Performed: Right implantable pulse generator placement with left chest implant (Right )  Patient Location: PACU  Anesthesia Type:General  Level of Consciousness: awake, alert , oriented and patient cooperative  Airway & Oxygen Therapy: Patient Spontanous Breathing and Patient connected to nasal cannula oxygen  Post-op Assessment: Report given to RN and Post -op Vital signs reviewed and stable  Post vital signs: Reviewed and stable  Last Vitals:  Vitals Value Taken Time  BP 146/95 01/05/20 0903  Temp    Pulse 85 01/05/20 0904  Resp 13 01/05/20 0904  SpO2 96 % 01/05/20 0904  Vitals shown include unvalidated device data.  Last Pain:  Vitals:   01/05/20 0643  TempSrc:   PainSc: 0-No pain         Complications: No apparent anesthesia complications

## 2020-01-05 NOTE — Discharge Summary (Signed)
Physician Discharge Summary  Patient ID: Bill Bowman MRN: [id number] DOB/AGE: 02/20/1951 69 y.o.  Admit date: 01/05/2020 Discharge date: 01/05/2020  Admission Diagnoses:Parkinson disease    Discharge Diagnoses: Parkinson disease s/p Right implantable pulse generator placement with left chest implant (Right) (Right brain to left chest)     Active Problems:   Parkinson's disease [facility])   Discharged Condition: good  Hospital Course: Bill Bowman was admitted for surgery for placement of implanted pulse generator with lead extensions for deep brain stimulator. Following uncomplicated surgery, he recovered nicely.   Consults: None  Significant Diagnostic Studies:   Treatments: surgery: Right implantable pulse generator placement with left chest implant (Right) (Right brain to left chest)    Discharge Exam: Blood pressure (!) 151/91, pulse 82, temperature 97.9 F (36.6 C), temperature source Oral, resp. rate 18, height 5\' 10"  (1.778 m), weight 104.3 kg, SpO2 94 %. Alert and mobilizing. Baseline Parkinson's limitations. DRsgs intact left chest and left scalp.  Disposition: Discharge to home. Office follow up in 2 weeks for incision check and staple removal. May remove dressings tomorrow. May shower any time, careful about staples. He has pain medications at home for prn use. There are no questions and answers to display.     Allergies as of 01/05/2020      Reactions   Clarithromycin Hypertension   Hydrocodone-homatropine Nausea And Vomiting   Liraglutide Nausea And Vomiting      Medication List    TAKE these medications   aspirin EC 81 MG tablet Take 81 mg by mouth daily.   Bydureon BCise 2 MG/0.85ML Auij Generic drug: Exenatide ER Inject 2 mg into the skin once a week.   carbidopa-levodopa 25-100 MG tablet Commonly known as: Sinemet Take 2 tablets by mouth 3 (three) times daily. What changed: how much to take   cephALEXin 250 MG  capsule Commonly known as: KEFLEX Take 1 capsule (250 mg total) by mouth 4 (four) times daily for 10 days.   meloxicam 15 MG tablet Commonly known as: MOBIC Take 15 mg by mouth daily.   metFORMIN 500 MG tablet Commonly known as: GLUCOPHAGE Take 1,000 mg by mouth 2 (two) times daily.   oxyCODONE 5 MG immediate release tablet Commonly known as: Oxy IR/ROXICODONE Take 1 tablet (5 mg total) by mouth every 6 (six) hours as needed for moderate pain.   oxyCODONE-acetaminophen 10-325 MG tablet Commonly known as: Percocet Take 1 tablet by mouth every 6 (six) hours as needed for pain.   PARoxetine 30 MG tablet Commonly known as: PAXIL Take 30 mg by mouth daily.   pramipexole 0.5 MG tablet Commonly known as: MIRAPEX TAKE 1 TABLET BY MOUTH 3 TIMES DAILY   ramipril 10 MG capsule Commonly known as: ALTACE Take 10 mg by mouth daily.   solifenacin 5 MG tablet Commonly known as: VESICARE Take 5 mg by mouth daily.   Toujeo SoloStar 300 UNIT/ML Sopn Generic drug: Insulin Glargine Inject 52 Units into the skin at bedtime.   traZODone 50 MG tablet Commonly known as: DESYREL Take 50 mg by mouth at bedtime.        Signed: [name], MD 01/05/2020, 10:36 AM

## 2020-01-05 NOTE — Plan of Care (Signed)
Pt doing well. Pt and wife given D/C instructions with verbal understanding. Pt's incisions are clean and dry with no sign of infection. Pt's IV was removed prior to D/C. PT D/C'd home via wheelchair per MD order. Pt is stable @ D/C and has no other needs at this time. Holli Humbles, RN

## 2020-01-05 NOTE — Progress Notes (Signed)
OT Cancellation Note  Patient Details Name: Bill Bowman MRN: [id number] DOB: [birth year]   Cancelled Treatment:    Reason Eval/Treat Not Completed: OT screened, no needs identified, will sign off. Per RN patient at baseline ADL level.  No OT needs identified.  OT will sign off.   Jolaine Artist, OT Acute Rehabilitation Services Pager [phone] Office [phone]   Delight Stare 01/05/2020, 12:50 PM

## 2020-01-05 NOTE — Evaluation (Signed)
Physical Therapy Evaluation and discharge Patient Details Name: Bill Bowman MRN: [id number] DOB: [birth year] Today's Date: 01/05/2020   History of Present Illness  Pt is a 69 y/o male s/p unilateral pulse generator implant secondary to parkinson's disease. PMH includes vertigo, HTN, Parkinson's disease, DM, and R TKA.   Clinical Impression  Patient evaluated by Physical Therapy with no further acute PT needs identified. All education has been completed and the patient has no further questions. Pt overall steady during gait and stair navigation. Required min guard to supervision for safety with use of cane. Pt reports wife will be able to assist at d/c. Plan to d/c this afternoon. See below for any follow-up Physical Therapy or equipment needs. PT is signing off. Thank you for this referral. If needs change, please re-consult.      Follow Up Recommendations No PT follow up;Supervision for mobility/OOB    Equipment Recommendations  None recommended by PT    Recommendations for Other Services       Precautions / Restrictions Precautions Precautions: Fall Restrictions Weight Bearing Restrictions: No      Mobility  Bed Mobility Overal bed mobility: Needs Assistance Bed Mobility: Supine to Sit;Sit to Supine     Supine to sit: Supervision Sit to supine: Supervision   General bed mobility comments: Supervision for safety.   Transfers Overall transfer level: Needs assistance Equipment used: Straight cane Transfers: Sit to/from Stand Sit to Stand: Min guard         General transfer comment: Min guard for safety. No LOB noted.   Ambulation/Gait Ambulation/Gait assistance: Min guard;Supervision Gait Distance (Feet): 200 Feet Assistive device: Straight cane Gait Pattern/deviations: Step-through pattern;Decreased stride length Gait velocity: Decreased   General Gait Details: Overall steady with use of cane. Min guard to supervision for safety. No physical assist  required.   Stairs Stairs: Yes Stairs assistance: Min guard Stair Management: One rail Right;With cane;Step to pattern;Forwards Number of Stairs: 8 General stair comments: Overall steady during stair navigation. Used rail on the L and cane on the right. Demonstrated good sequencing using cane.   Wheelchair Mobility    Modified Rankin (Stroke Patients Only)       Balance Overall balance assessment: Mild deficits observed, not formally tested                                           Pertinent Vitals/Pain Pain Assessment: 0-10 Pain Score: 5  Pain Location: headache Pain Descriptors / Indicators: Grimacing;Headache Pain Intervention(s): Limited activity within patient's tolerance;Monitored during session;Repositioned    Home Living Family/patient expects to be discharged to:: Private residence Living Arrangements: Spouse/significant other Available Help at Discharge: Family;Available 24 hours/day Type of Home: House Home Access: Stairs to enter Entrance Stairs-Rails: Psychiatric nurse of Steps: 8 Home Layout: Two level;Able to live on main level with bedroom/bathroom Home Equipment: Kasandra Knudsen - single point      Prior Function Level of Independence: Independent with assistive device(s)         Comments: Uses cane for ambulation     Hand Dominance        Extremity/Trunk Assessment   Upper Extremity Assessment Upper Extremity Assessment: Defer to OT evaluation    Lower Extremity Assessment Lower Extremity Assessment: Generalized weakness    Cervical / Trunk Assessment Cervical / Trunk Assessment: Other exceptions Cervical / Trunk Exceptions: forward head posture with rounded shoulders.  Communication   Communication: HOH  Cognition Arousal/Alertness: Awake/alert Behavior During Therapy: WFL for tasks assessed/performed Overall Cognitive Status: Within Functional Limits for tasks assessed                                         General Comments General comments (skin integrity, edema, etc.): Pt's wife present during session.     Exercises     Assessment/Plan    PT Assessment Patent does not need any further PT services  PT Problem List         PT Treatment Interventions      PT Goals (Current goals can be found in the Care Plan section)  Acute Rehab PT Goals Patient Stated Goal: to go home today PT Goal Formulation: With patient Time For Goal Achievement: 01/05/20 Potential to Achieve Goals: Good    Frequency     Barriers to discharge        Co-evaluation               AM-PAC PT "6 Clicks" Mobility  Outcome Measure Help needed turning from your back to your side while in a flat bed without using bedrails?: None Help needed moving from lying on your back to sitting on the side of a flat bed without using bedrails?: None Help needed moving to and from a bed to a chair (including a wheelchair)?: A Little Help needed standing up from a chair using your arms (e.g., wheelchair or bedside chair)?: A Little Help needed to walk in hospital room?: A Little Help needed climbing 3-5 steps with a railing? : A Little 6 Click Score: 20    End of Session Equipment Utilized During Treatment: Gait belt Activity Tolerance: Patient tolerated treatment well Patient left: in bed;with call bell/phone within reach Nurse Communication: Mobility status;Patient requests pain meds PT Visit Diagnosis: Other abnormalities of gait and mobility (R26.89);Other symptoms and signs involving the nervous system (R29.898);Pain Pain - part of body: (head)    Time: 1036-1050 PT Time Calculation (min) (ACUTE ONLY): 14 min   Charges:   PT Evaluation $PT Eval Low Complexity: 1 Low          Lou Miner, DPT  Acute Rehabilitation Services  Pager: [phone] Office: [phone]   Rudean Hitt 01/05/2020, 11:01 AM

## 2020-01-05 NOTE — Care Management (Signed)
No CM needs identified for DC.

## 2020-01-05 NOTE — H&P (Signed)
Erline Levine, MD  Physician  Neurosurgery     H&P     Signed     Date of Service:  12/15/2019  8:51 AM        Related encounter: Admission (Discharged) from 12/20/2019 in [place] buttonCollapse widget button    Show:Clear all   ManualTemplateCopied  Added by:     Erline Levine, MD   Hover for detailscustomization button             Patient ID:                [id number]  Patient:                     Bill Bowman                                      Date of Birth:   1951-09-23  Visit Type:                Office Visit                                                         Date:    05/23/2019 11:30 AM  Provider:                   Marchia Meiers. Vertell Limber MD      This 69 year old male presents for Tremors.      HISTORY OF PRESENT ILLNESS:    1.  Tremors      Bill Bowman, 69 year old retired male, visits to discuss deep brain stimulation for Parkinson's disease.  Patient reports left arm and leg tremor increasing since 2018.  He visits on referral from Dr. Wells Guiles Tat, who recommends a [employer] Scientific right STN device. Patient reports that his tremor increases when he is engaging in tasks. Patient reports that his tremor increases with his quality of life. Patient reports he is unable to hold a plate in his left hand. Patient reports he sometimes has trouble with walking. Patient reports medication does not help the tremor, though it does improve the patient's stiffness.      History:  HTN, NIDDM, vertigo, depression, Parkinson's, cataracts  Surgical history:  Right TKR 2014?, left shoulder replacement January 2019                      Medical/Surgical/Interim History  Reviewed, no change.             Family History:    Reviewed, no changes.       Social History:  Reviewed, no changes.      MEDICATIONS:  (added, continued or stopped this visit)   Started   Medication   Directions   Instruction   Stopped        allopurinol 100 mg tablet   take 1 tablet by oral route  every day                Altace 10  mg capsule   take 1 capsule by oral route  every day                Bydureon BCise 2 mg/0.85 mL subcutaneous auto-injector   inject (2MG )  by subcutaneous route  every 7 days in the abdomen, thighs, or outer area of upper arm rotating injectionsites                carbidopa 25 mg-levodopa 100 mg tablet   take 1 tablet by oral route 3 times every day                ketorolac 10 mg tablet   take 1 tablet by oral route  every 4 hours as needed for up to 5 days total use                meclizine 25 mg tablet   take 1 tablet by oral route 3 times every day as needed                meloxicam 15 mg tablet   take 1 tablet by oral route  every day                metformin 500 mg tablet   take 1 tablet by oral route 2 times every day with morning and evening meals                oxycodone 5 mg tablet   take 1 tablet by oral route  every 4 - 6 hours as needed                Paxil 30 mg tablet   take 1 tablet by oral route  every day                pramipexole 0.5 mg tablet   take 1 tablet by oral route 3 times every day                Toujeo SoloStar U-300 Insulin 300 unit/mL (1.5 mL) subcutaneous pen   inject 26 units by subcutaneous route  every day                trazodone 50 mg tablet   take 1 tablet by oral route  every day after meals                     ALLERGIES:   Ingredient   Reaction   Medication Name   Comment    HYDROCODONE                LIRAGLUTIDE                CLARITHROMYCIN                HOMATROPINE                          REVIEW OF SYSTEMS       See scanned patient registration form, dated,  signed and dated on      Review of Systems Details   System   Neg/Pos   Details    Constitutional   Negative   Chills, Fatigue, Fever, Malaise, Night sweats, Weight gain and Weight loss.    ENMT   Negative   Ear drainage, Hearing loss, Nasal drainage, Otalgia, Sinus pressure and Sore throat.    Eyes   Negative   Eye discharge, Eye pain and Vision  changes.    Respiratory   Negative   Chronic cough, Cough, Dyspnea, Known TB exposure and Wheezing.    Cardio   Negative   Chest pain, Claudication, Edema and Irregular heartbeat/palpitations.    GI   Negative   Abdominal pain, Blood in stool, Change in stool pattern, Constipation, Decreased appetite, Diarrhea, Heartburn, Nausea and Vomiting.    GU   Negative   Dribbling, Dysuria, Erectile dysfunction, Hematuria, Polyuria (Genitourinary), Slow stream, Urinary frequency, Urinary incontinence and Urinary retention.    Endocrine   Negative   Cold intolerance, Heat intolerance, Polydipsia and Polyphagia.    Neuro   Positive   Tremors.    Psych   Negative   Anxiety, Depression and Insomnia.    Integumentary   Negative   Brittle hair, Brittle nails, Change in shape/size of mole(s), Hair loss, Hirsutism, Hives, Pruritus, Rash and Skin lesion.    MS   Negative   Back pain, Joint pain, Joint swelling, Muscle weakness and Neck pain.    Hema/Lymph   Negative   Easy bleeding, Easy bruising and Lymphadenopathy.    Allergic/Immuno   Negative   Contact allergy, Environmental allergies, Food allergies and Seasonal allergies.    Reproductive   Negative   Penile discharge and Sexual dysfunction.        PHYSICAL EXAM:       Vitals   Date   Temp F   BP   Pulse   Ht In   Wt Lb   BMI   BSA   Pain Score    05/23/2019   97.3   136/87   90   68   221   33.6       0/10          PHYSICAL EXAM Details  General  Level of  Distress:        no acute distress  Overall Appearance:   normal     Head and Face                                      Right                           Left                   Fundoscopic Exam:    normal                         normal           Cardiovascular  Cardiac:                      regular rate and rhythm without murmur                                      Right                           Left              Carotid Pulses:           normal  normal     Respiratory  Lungs:                         clear to auscultation     Neurological  Orientation:                                         normal  Recent and Remote Memory:            normal  Attention Span and Concentration:    normal  Language:                                          normal  Fund of Knowledge:                           normal                                                             Right                           Left  Sensation:                                          normal                         normal  Upper Extremity Coordination:           normal                         normal   Lower Extremity Coordination:           normal                         normal     Musculoskeletal  Gait and Station:                                 normal                                                             Right                           Left  Upper Extremity Muscle Strength:      normal                         normal  Lower Extremity Muscle Strength:      normal  normal  Upper Extremity Muscle Tone:           normal                         tremor  Lower Extremity Muscle Tone:           normal                         normal        Motor Strength  Upper and lower extremity motor strength was tested in the clinically pertinent muscles.            Deep Tendon Reflexes                                      Right                            Left  Biceps:                        normal                         normal  Triceps:                       normal                         normal  Brachioradialis:           normal                         normal  Patellar:                       normal                         normal  Achilles:                      normal                         normal     Cranial Nerves  II. Optic Nerve/Visual Fields:        normal  III. Oculomotor:                     normal  IV. Trochlear:                        normal  V. Trigeminal:                       normal  VI. Abducens:                       normal  VII. Facial:                            normal  VIII. Acoustic/Vestibular:      normal  IX. Glossopharyngeal:          normal  X. Vagus:                              normal  XI. Spinal Accessory:           normal  XII. Hypoglossal:                  normal     Motor and other Tests  Lhermittes:                  negative  Rhomberg:                  negative  Pronator drift:              absent                                                                               Right                           Left  Hoffman's:                   normal                         normal  Clonus:                        normal                         normal  Babinski:                     normal                         normal        Additional Findings:     Rest tremor left arm greater than leg with mild cogwheeling rigidity            IMPRESSION:       Discussed deep brain stimulator at length with patient and the surgery. Gave patient a pamphlet with additional information. Upon examination, when patient is distracted, his tremor increases. Patient has very little tremor on the right side. Patient would like to elect to have his surgery in January due to deer hunting season. Patient would like the battery on the left side of his chest, also due to deer  hunting.        PLAN:    1) Right STN deep brain stimulator for Parkinson's disease     Orders:  Instruction(s)/Education:   Assessment   Instruction    I10   Hypertension education    [phone]   Lifestyle education regarding diet       Completed Orders (this encounter)   Order   Details   Reason   Side   Interpretation   Result   Initial Treatment Date   Region    Hypertension education   Patient to follow up with primary care provider  Lifestyle education regarding diet   Patient encouraged to eat a well balance diet                                Assessment/Plan        #   Detail Type   Description     1.   Assessment   Essential (primary) hypertension (I10).                  2.   Assessment   Body mass index (BMI) 33.0-33.9, adult [id number]).        Plan Orders   Today's instructions / counseling include(s) Lifestyle education regarding diet. Clinical information/comments: Patient encouraged to eat a well balance diet.                          Pain Management Plan  Pain Scale: 0/10.  Method: Numeric Pain Intensity Scale.  Location: brain.  Onset: 05/22/2017.  Duration: varies.  Quality: discomforting.  Pain management follow-up plan of care: Patient taking medication as prescribed.     Fall Risk Plan  The patient has fallen 1 times in the last year.   Falls risk follow-up plan of care:  Assisted devices: Advise to use safety measures when necessary.                              Provider:  Marchia Meiers. Vertell Limber MD  05/23/2019 05:22 PM           Dictation edited by: Judd Gaudier           CC Providers:  Alonza Bogus   [address] [place], [place] [address]                                            Electronically signed by Marchia Meiers. Vertell Limber MD on 05/23/2019  05:22 PM

## 2020-01-05 NOTE — Interval H&P Note (Signed)
History and Physical Interval Note:  01/05/2020 7:32 AM  Bill Bowman  has presented today for surgery, with the diagnosis of Parkinson disease.  The various methods of treatment have been discussed with the patient and family. After consideration of risks, benefits and other options for treatment, the patient has consented to  Procedure(s) with comments: Right implantable pulse generator placement (Right) - Right implantable pulse generator placement to left chest as a surgical intervention.  The patient's history has been reviewed, patient examined, no change in status, stable for surgery.  I have reviewed the patient's chart and labs.  Questions were answered to the patient's satisfaction.     [name]

## 2020-01-05 NOTE — Brief Op Note (Signed)
01/05/2020  8:51 AM  PATIENT:  Bill Bowman  69 y.o. male  PRE-OPERATIVE DIAGNOSIS:  Parkinson disease  POST-OPERATIVE DIAGNOSIS:  Parkinson disease  PROCEDURE:  Procedure(s): Right implantable pulse generator placement with left chest implant (Right) (Right brain to left chest)  SURGEON:  Surgeon(s) and Role:    Erline Levine, MD - Primary  PHYSICIAN ASSISTANT:   ASSISTANTS: P[place], RN   ANESTHESIA:   general  EBL: Minimal   BLOOD ADMINISTERED:none  DRAINS: none   LOCAL MEDICATIONS USED:  MARCAINE    and LIDOCAINE   SPECIMEN:  No Specimen  DISPOSITION OF SPECIMEN:  N/A  COUNTS:  YES  TOURNIQUET:  * No tourniquets in log *  DICTATION: DICTATION: Patient has implanted right subthalamic stimulator electrode having recently completed DBS Stage I and now presents for placement of lead extensions and IPG implantation.  PROCEDURE: Patient was brought to the operating room and GETA anesthesia was induced.  Left upper chest, scalp, neck were prepped with betadine scrub and Duraprep.  Area of planned incision was infiltrated with lidocaine.  Scalp incision was made and the lead extension was exposed. An incision was made in the left upper chest and a pocket was created.  Extension tunnel was made from scalp to pocket.  [employer] rechargeable IPG was placed and attached to lead extensions, which in turn were connected to cranial lead and torqued appropriately.    The IPG  was placed in the pocket and anchored with a single 2-0 silk stitch.  Impedances were checked and were acceptable.  Wounds were irrigated with vancomycin.  Incisions were closed with 2-0 Vicryl and 3-0 vicryl sutures at the pocket and 2-0 vicryl at the scalp with staples. and dressed with a sterile occlusive dressing.  Counts were correct at the end of the case.  PLAN OF CARE: Admit for overnight observation  PATIENT DISPOSITION:  PACU - hemodynamically stable.   Delay start of Pharmacological  VTE agent (>24hrs) due to surgical blood loss or risk of bleeding: yes

## 2020-01-05 NOTE — Op Note (Signed)
01/05/2020  8:51 AM  PATIENT:  Bill Bowman  69 y.o. male  PRE-OPERATIVE DIAGNOSIS:  Parkinson disease  POST-OPERATIVE DIAGNOSIS:  Parkinson disease  PROCEDURE:  Procedure(s): Right implantable pulse generator placement with left chest implant (Right) (Right brain to left chest)  SURGEON:  Surgeon(s) and Role:    Erline Levine, MD - Primary  PHYSICIAN ASSISTANT:   ASSISTANTS: Poteat, RN   ANESTHESIA:   general  EBL: Minimal   BLOOD ADMINISTERED:none  DRAINS: none   LOCAL MEDICATIONS USED:  MARCAINE    and LIDOCAINE   SPECIMEN:  No Specimen  DISPOSITION OF SPECIMEN:  N/A  COUNTS:  YES  TOURNIQUET:  * No tourniquets in log *  DICTATION: DICTATION: Patient has implanted right subthalamic stimulator electrode having recently completed DBS Stage I and now presents for placement of lead extensions and IPG implantation.  PROCEDURE: Patient was brought to the operating room and GETA anesthesia was induced.  Left upper chest, scalp, neck were prepped with betadine scrub and Duraprep.  Area of planned incision was infiltrated with lidocaine.  Scalp incision was made and the lead extension was exposed. An incision was made in the left upper chest and a pocket was created.  Extension tunnel was made from scalp to pocket.  [employer] rechargeable IPG was placed and attached to lead extensions, which in turn were connected to cranial lead and torqued appropriately.    The IPG  was placed in the pocket and anchored with a single 2-0 silk stitch.  Impedances were checked and were acceptable.  Wounds were irrigated with vancomycin.  Incisions were closed with 2-0 Vicryl and 3-0 vicryl sutures at the pocket and 2-0 vicryl at the scalp with staples. and dressed with a sterile occlusive dressing.  Counts were correct at the end of the case.  PLAN OF CARE: Admit for overnight observation  PATIENT DISPOSITION:  PACU - hemodynamically stable.   Delay start of Pharmacological  VTE agent (>24hrs) due to surgical blood loss or risk of bleeding: yes

## 2020-01-05 NOTE — Anesthesia Postprocedure Evaluation (Signed)
Anesthesia Post Note  Patient: Bill Bowman  Procedure(s) Performed: Right implantable pulse generator placement with left chest implant (Right )     Patient location during evaluation: PACU Anesthesia Type: General Level of consciousness: sedated and patient cooperative Pain management: pain level controlled Vital Signs Assessment: post-procedure vital signs reviewed and stable Respiratory status: spontaneous breathing Cardiovascular status: stable Anesthetic complications: no    Last Vitals:  Vitals:   01/05/20 0943 01/05/20 1200  BP: (!) 151/91 (!) 135/97  Pulse: 82 90  Resp: 18 16  Temp: 36.6 C 36.6 C  SpO2: 94% 95%    Last Pain:  Vitals:   01/05/20 1300  TempSrc:   PainSc: 1                  Nolon Nations

## 2020-01-10 ENCOUNTER — Encounter: Payer: Self-pay | Admitting: *Deleted

## 2020-01-18 ENCOUNTER — Telehealth: Payer: Self-pay | Admitting: Neurology

## 2020-01-18 NOTE — Telephone Encounter (Signed)
That is ok, so long as we receive on Friday.  Thank you

## 2020-01-18 NOTE — Telephone Encounter (Signed)
Bill Bowman called from [facility] and she received the message from our office. She said the Courier has already came by there today. She said he should be back on Friday and he can have it delivered to our office. Please Call to confirm with her that it's ok. Thank you

## 2020-01-18 NOTE — Progress Notes (Signed)
Bill Bowman was seen today in follow up for Parkinsons disease.  My previous records as well as those of Dr. Vertell Bowman were reviewed prior to todays visit.  Patient had right STN DBS on January 21.  IPG was placed on January 28.  Patient did not have any significant complications.   Pt here for activation of device.  No f/c.   Pt denies falls.  Pt denies lightheadedness, near syncope.  No hallucinations.  Mood has been good.  Postop CT has been personally reviewed by myself.  Lead is in good position.  Current prescribed movement disorder medications:  Carbidopa/levodopa 25/100, 2 tablets 3 times per day Pramipexole 0.5 mg 3 times per day Paroxetine 30 mg daily Trazodone, 50 mg at bedtime (prescribed by primary care)   ALLERGIES:   Allergies  Allergen Reactions  . Clarithromycin Hypertension  . Hydrocodone-Homatropine Nausea And Vomiting  . Liraglutide Nausea And Vomiting    CURRENT MEDICATIONS:  Outpatient Encounter Medications as of 01/23/2020  Medication Sig  . aspirin EC 81 MG tablet Take 81 mg by mouth daily.  . carbidopa-levodopa (SINEMET) 25-100 MG tablet Take 2 tablets by mouth 3 (three) times daily. (Patient taking differently: Take 1 tablet by mouth 3 (three) times daily. )  . Exenatide ER (BYDUREON BCISE) 2 MG/0.85ML AUIJ Inject 2 mg into the skin once a week.   . Insulin Glargine (TOUJEO SOLOSTAR) 300 UNIT/ML SOPN Inject 52 Units into the skin at bedtime.   . meloxicam (MOBIC) 15 MG tablet Take 15 mg by mouth daily.   . metFORMIN (GLUCOPHAGE) 500 MG tablet Take 1,000 mg by mouth 2 (two) times daily.  Marland Kitchen PARoxetine (PAXIL) 30 MG tablet Take 30 mg by mouth daily.  . pramipexole (MIRAPEX) 0.5 MG tablet TAKE 1 TABLET BY MOUTH 3 TIMES DAILY (Patient taking differently: Take 0.5 mg by mouth 3 (three) times daily. )  . ramipril (ALTACE) 10 MG capsule Take 10 mg by mouth daily.  . solifenacin (VESICARE) 5 MG tablet Take 5 mg by mouth daily.  . traZODone (DESYREL) 50 MG  tablet Take 50 mg by mouth at bedtime.  Marland Kitchen oxyCODONE (OXY IR/ROXICODONE) 5 MG immediate release tablet Take 1 tablet (5 mg total) by mouth every 6 (six) hours as needed for moderate pain. (Patient not taking: Reported on 01/23/2020)  . oxyCODONE-acetaminophen (PERCOCET) 10-325 MG tablet Take 1 tablet by mouth every 6 (six) hours as needed for pain. (Patient not taking: Reported on 01/23/2020)   No facility-administered encounter medications on file as of 01/23/2020.    PHYSICAL EXAMINATION:    VITALS:   Vitals:   01/23/20 1421  BP: (!) 159/89  Pulse: 87  SpO2: 97%  Weight: 224 lb 6.4 oz (101.8 kg)  Height: 5\' 10"  (1.778 m)    GEN:  The patient appears stated age and is in NAD. HEENT:  Normocephalic, atraumatic.  The mucous membranes are moist. The superficial temporal arteries are without ropiness or tenderness. CV:  RRR Lungs:  CTAB Neck/HEME:  There are no carotid bruits bilaterally. Skin: Scalp and chest incisions are healing well.  No erythema or drainage.  Neurological examination:  Orientation: The patient is alert and oriented x3. Cranial nerves: There is good facial symmetry with minimal facial hypomimia. The speech is fluent and clear. Soft palate rises symmetrically and there is no tongue deviation. Hearing is intact to conversational tone. Sensation: Sensation is intact to light touch throughout Motor: Strength is at least antigravity x4.  Movement examination: Tone:  There is normal tone in the UE/LE Abnormal movements: Intermittent left upper extremity rest tremor prior to programming.  There is very rare right tremor Coordination:  There is minimal decremation with RAM's, with hand opening and closing and finger taps on the left prior to programming Gait and Station: The patient ambulates with his cane  I have reviewed and interpreted the following labs independently   Chemistry      Component Value Date/Time   NA 134 (L) 12/29/2019 0625   K 4.2 12/29/2019 0625    CL 98 12/29/2019 0625   CO2 26 12/29/2019 0625   BUN 17 12/29/2019 0625   CREATININE 1.04 12/29/2019 0625      Component Value Date/Time   CALCIUM 9.2 12/29/2019 0625   ALKPHOS 43 12/29/2019 0625   AST 37 12/29/2019 0625   ALT 60 (H) 12/29/2019 0625   BILITOT 1.0 12/29/2019 0625     Lab Results  Component Value Date   WBC 5.6 12/29/2019   HGB 12.5 (L) 12/29/2019   HCT 39.7 12/29/2019   MCV 77.5 (L) 12/29/2019   PLT 241 12/29/2019      ASSESSMENT/PLAN:  1.  Parkinsons Disease, diagnosed January, 2019 with symptoms 2 years prior  -Patient with right STN DBS surgery on December 29, 2019.  Patient had his IPG placed on January 05, 2020.  -DBS programming was performed today.  This is described in more detail on a separate programming procedure note  -Patient will decrease his carbidopa/levodopa 25/100 from 2 tablets 3 times per day to carbidopa/levodopa 25/100, 2 tablets in the morning, 1 in the afternoon and 1 in the evening.  He will let me know if he has left arm dyskinesia  -For now, patient will remain on pramipexole 0.5 mg 3 times per day.  He understands risk, benefits, and side effects.   2.    Insomnia             -he is on trazodone.    Primary care is prescribing.  3.  Dysphagia             -refuses MBE.  Wife did state that this is getting worse.  Total time spent on today's visit was 40 minutes, including both face-to-face time and nonface-to-face time.  Time included that spent on review of records (prior notes available to me/labs/imaging if pertinent), discussing treatment and goals, answering patient's questions and coordinating care.  This did not include DBS time which was separately described on a procedure note.  Cc:  Bill Rings, MD

## 2020-01-18 NOTE — Telephone Encounter (Signed)
Ashley is aware.  

## 2020-01-18 NOTE — Telephone Encounter (Signed)
See message below, no courier runs to them on Thursday I did call and confirm that. Is this okay?

## 2020-01-23 ENCOUNTER — Ambulatory Visit (INDEPENDENT_AMBULATORY_CARE_PROVIDER_SITE_OTHER): Payer: Medicare Other | Admitting: Neurology

## 2020-01-23 ENCOUNTER — Encounter: Payer: Self-pay | Admitting: Neurology

## 2020-01-23 ENCOUNTER — Other Ambulatory Visit: Payer: Self-pay

## 2020-01-23 DIAGNOSIS — R42 Dizziness and giddiness: Secondary | ICD-10-CM | POA: Insufficient documentation

## 2020-01-23 DIAGNOSIS — I10 Essential (primary) hypertension: Secondary | ICD-10-CM | POA: Insufficient documentation

## 2020-01-23 DIAGNOSIS — E785 Hyperlipidemia, unspecified: Secondary | ICD-10-CM | POA: Insufficient documentation

## 2020-01-23 DIAGNOSIS — K760 Fatty (change of) liver, not elsewhere classified: Secondary | ICD-10-CM | POA: Insufficient documentation

## 2020-01-23 DIAGNOSIS — G20 Parkinson's disease: Secondary | ICD-10-CM | POA: Diagnosis not present

## 2020-01-23 NOTE — Procedures (Signed)
DBS Programming was performed.    Manufacturer of DBS device: [employer]  Total time spent programming was 60 minutes.  Device was turned on.  Soft start was confirmed to be on.  Impedences were checked and were within normal limits.  Battery was checked and was determined to be functioning normally and not near the end of life.  Final settings were as follows:   Active Contacts Amplitude (mA) PW (ms) Frequency (hz) Side  effects  Left Brain       n/a                            Right Brain       01/23/20 1-(70%)2/3/4(10% each)-C+ 3.7 60 130   01/23/20 monopolar review 2/3/4(33% each)-C+ 4.0 60 130 More tremor; numb elbow; dystonic hand  Monopolar review 5/6/7 (33% each)-C+ 3.9 60 130 More tremor; numb elbow; arm heavy  Monopolar review 8-C+ 3.9 60 130 Significant tremor

## 2020-01-23 NOTE — Patient Instructions (Signed)
1.  Continue pramipexole. 0.5 mg three times per day 2.  Take carbidopa/levodopa 25/100, 2 tablet in the AM, 1 in the afternoon and 1 in the evening.  Let me know if you have extra movement/flinging in the left arm  The physicians and staff at [facility] are committed to providing excellent care. You may receive a survey requesting feedback about your experience at our office. We strive to receive "very good" responses to the survey questions. If you feel that your experience would prevent you from giving the office a "very good " response, please contact our office to try to remedy the situation. We may be reached at [phone]. Thank you for taking the time out of your busy day to complete the survey.

## 2020-01-28 NOTE — Progress Notes (Signed)
Bill Bowman was seen today in follow up for Parkinsons disease.  My previous records were reviewed.  Wife accompanies the patient and supplements the history.  Patients DBS was activated 1 week ago today.  Reports that he is really happy with the device.  He has had no dyskinesia.  He has had no tremor at all on the left and he is really pleased about that.  He is not happy with his balance, but that was happening prior to DBS surgery.  Some days, his balance may be good and other days it is not good.  There does not seem to be any rhyme or reason associated with that.  His memory has not been great, but that also was prior to DBS surgery.  Current prescribed movement disorder medications:  Carbidopa/levodopa 25/100, 2 tablets in the morning, 1 in the afternoon and 1 in the evening (decreased last visit from 2 tablets 3 times per day) Pramipexole 0.5 mg 3 times per day Paroxetine 30 mg daily Trazodone, 50 mg at bedtime (prescribed by primary care)   ALLERGIES:   Allergies  Allergen Reactions  . Clarithromycin Hypertension  . Hydrocodone-Homatropine Nausea And Vomiting  . Liraglutide Nausea And Vomiting    CURRENT MEDICATIONS:  Outpatient Encounter Medications as of 01/30/2020  Medication Sig  . aspirin EC 81 MG tablet Take 81 mg by mouth daily.  . carbidopa-levodopa (SINEMET) 25-100 MG tablet Take 2 tablets by mouth 3 (three) times daily. (Patient taking differently: Take 1 tablet by mouth 3 (three) times daily. )  . Exenatide ER (BYDUREON BCISE) 2 MG/0.85ML AUIJ Inject 2 mg into the skin once a week.   . Insulin Glargine (TOUJEO SOLOSTAR) 300 UNIT/ML SOPN Inject 52 Units into the skin at bedtime.   . meloxicam (MOBIC) 15 MG tablet Take 15 mg by mouth daily.   . metFORMIN (GLUCOPHAGE) 500 MG tablet Take 1,000 mg by mouth 2 (two) times daily.  Marland Kitchen oxyCODONE (OXY IR/ROXICODONE) 5 MG immediate release tablet Take 1 tablet (5 mg total) by mouth every 6 (six) hours as needed for  moderate pain.  Marland Kitchen oxyCODONE-acetaminophen (PERCOCET) 10-325 MG tablet Take 1 tablet by mouth every 6 (six) hours as needed for pain.  Marland Kitchen PARoxetine (PAXIL) 30 MG tablet Take 30 mg by mouth daily.  . pramipexole (MIRAPEX) 0.5 MG tablet TAKE 1 TABLET BY MOUTH 3 TIMES DAILY (Patient taking differently: Take 0.5 mg by mouth 3 (three) times daily. )  . ramipril (ALTACE) 10 MG capsule Take 10 mg by mouth daily.  . solifenacin (VESICARE) 5 MG tablet Take 5 mg by mouth daily.  . traZODone (DESYREL) 50 MG tablet Take 50 mg by mouth at bedtime.   No facility-administered encounter medications on file as of 01/30/2020.    PHYSICAL EXAMINATION:    VITALS:   Vitals:   01/30/20 1400  BP: (!) 154/96  Pulse: 86  SpO2: 95%  Weight: 228 lb (103.4 kg)  Height: 5\' 10"  (1.778 m)    GEN:  The patient appears stated age and is in NAD. HEENT:  Normocephalic, atraumatic.  The mucous membranes are moist. The superficial temporal arteries are without ropiness or tenderness. CV:  RRR Lungs:  CTAB Neck/HEME:  There are no carotid bruits bilaterally. Skin: Scalp and chest incisions are healing well.  No erythema or drainage.  Neurological examination:  Orientation: The patient is alert and oriented x3. Cranial nerves: There is good facial symmetry with minimal facial hypomimia. The speech is fluent and  clear. Soft palate rises symmetrically and there is no tongue deviation. Hearing is intact to conversational tone. Sensation: Sensation is intact to light touch throughout Motor: Strength is at least antigravity x4.  Movement examination: Tone: There is normal tone in the UE/LE Abnormal movements: No tremor is noted, in the right or left hand. Coordination:  There is mild decremation with any form of RAMS, including alternating supination and pronation of the forearm, hand opening and closing, finger taps, heel taps and toe taps on the right (side that didn't have dbs) Gait and Station: The patient ambulates  with his cane.  He is pretty steady with a cane.  I have reviewed and interpreted the following labs independently   Chemistry      Component Value Date/Time   NA 134 (L) 12/29/2019 0625   K 4.2 12/29/2019 0625   CL 98 12/29/2019 0625   CO2 26 12/29/2019 0625   BUN 17 12/29/2019 0625   CREATININE 1.04 12/29/2019 0625      Component Value Date/Time   CALCIUM 9.2 12/29/2019 0625   ALKPHOS 43 12/29/2019 0625   AST 37 12/29/2019 0625   ALT 60 (H) 12/29/2019 0625   BILITOT 1.0 12/29/2019 0625     Lab Results  Component Value Date   WBC 5.6 12/29/2019   HGB 12.5 (L) 12/29/2019   HCT 39.7 12/29/2019   MCV 77.5 (L) 12/29/2019   PLT 241 12/29/2019      ASSESSMENT/PLAN:  1.  Parkinsons Disease, diagnosed January, 2019 with symptoms 2 years prior  -Patient with right STN DBS surgery on December 29, 2019.  Patient had his IPG placed on January 05, 2020.  -Gave patient a prescription for physical therapy.  He wants to go somewhere in [place], where he lives.  Balance is his biggest complaints, but that started prior to having DBS.  Discussed importance of exercise.    -Patient will continue carbidopa/levodopa 25/100, 2 tablets in the morning, 1 in the afternoon and 1 in evening  -Patient will continue pramipexole, 0.5 mg 3 times per day.  We will need to watch that medication as patient/family are describing some memory change, but really that has been stable for quite some time.  Last neurocognitive testing did not show any evidence of dementia.   2.    Insomnia             -he is on trazodone.    Primary care is prescribing.  3.  Dysphagia             -refuses MBE.  Wife did state that this is getting worse.  Total time spent on today's visit was 35  minutes, including both face-to-face time and nonface-to-face time.  Time included that spent on review of records (prior notes available to me/labs/imaging if pertinent), discussing treatment and goals, answering patient's questions  and coordinating care.  This didn't include DBS time.  Cc:  Ma Rings, MD

## 2020-01-30 ENCOUNTER — Ambulatory Visit (INDEPENDENT_AMBULATORY_CARE_PROVIDER_SITE_OTHER): Payer: Medicare Other | Admitting: Neurology

## 2020-01-30 ENCOUNTER — Encounter: Payer: Self-pay | Admitting: Neurology

## 2020-01-30 ENCOUNTER — Other Ambulatory Visit: Payer: Self-pay

## 2020-01-30 VITALS — BP 154/96 | HR 86 | Ht 70.0 in | Wt 228.0 lb

## 2020-01-30 DIAGNOSIS — R1319 Other dysphagia: Secondary | ICD-10-CM | POA: Diagnosis not present

## 2020-01-30 DIAGNOSIS — R270 Ataxia, unspecified: Secondary | ICD-10-CM

## 2020-01-30 DIAGNOSIS — G20 Parkinson's disease: Secondary | ICD-10-CM | POA: Diagnosis not present

## 2020-01-30 NOTE — Procedures (Signed)
DBS Programming was performed.    Manufacturer of DBS device: [employer]  Total time spent programming was 10 minutes.  Device was on.  Soft start was confirmed to be on.  Impedences were checked and were within normal limits.  Battery was checked and was determined to be functioning normally and not near the end of life (pt had not yet charged it from last visit).  Final settings were as follows:   Active Contacts Amplitude (mA) PW (ms) Frequency (hz) Side  effects  Left Brain       n/a                            Right Brain       01/30/20 1-(80%)2-(8%)3-(6%)4-(6%) 3.7 60 130   01/23/20 1-(70%)2/3/4(10% each)-C+ 3.7 60 130   01/23/20 monopolar review 2/3/4(33% each)-C+ 4.0 60 130 More tremor; numb elbow; dystonic hand  Monopolar review 5/6/7 (33% each)-C+ 3.9 60 130 More tremor; numb elbow; arm heavy  Monopolar review 8-C+ 3.9 60 130 Significant tremor

## 2020-01-31 ENCOUNTER — Other Ambulatory Visit: Payer: Self-pay

## 2020-01-31 MED ORDER — PRAMIPEXOLE DIHYDROCHLORIDE 0.5 MG PO TABS: 0.5000 mg | ORAL_TABLET | Freq: Three times a day (TID) | ORAL | 1 refills | Status: DC

## 2020-01-31 NOTE — Telephone Encounter (Signed)
Rx(s) sent to pharmacy electronically.  

## 2020-06-25 NOTE — Progress Notes (Signed)
Assessment/Plan:   1.  Parkinsons Disease  -Patient is status post right STN DBS surgery on December 29, 2019.  IPG was placed on January 05, 2020.  -Continue carbidopa/levodopa 25/100, 2/1/1  -decrease pramipexole 0.5 mg, half tablet 3 times per day.  May continue tapering next visit.  May have compulsive behavior (inappropriate behavior towards women).  Not sure that this is the medication, but will go ahead and taper it.  -DBS adjusted today.  He has had multiple falls, but refused physical therapy in the past.  Was agreeable today.  We will send that.  -Had left foot dyskinesia.  Have adjusted the DBS today.  Seems somewhat better when he left.  -Clearly there is marital stress going on.  Both the patient and wife talk to me, both together and separate.  We will see if decreasing pramipexole helps  -Patient asked me to write him a letter and send it to his house regarding compulsive behavior risk with pramipexole.  Did this.  2.  Insomnia  -Primary care treating with trazodone  3.  Dysphagia  -Declines MBE.  4.  I plan to see the patient back in 3 weeks, mostly because he was not doing well and may need further DBS adjustment.  Subjective:   Bill Bowman was seen today in follow up for Parkinsons disease.  My previous records were reviewed prior to todays visit as well as outside records available to me.  Patient was given a prescription last visit for physical therapy for balance.  He didn't go - "I don't like physical therapists."  Pt had a fall for which he saw his primary care in May.  The patient was fishing and fell in the water.  He hurt his left leg.  He had an x-ray and that was negative for fracture.  There was a fluid collection within the soft tissue of the left thigh, which was felt possibly hematoma when it was ultrasounded.  Those records are reviewed.  Pt has had multiple other falls, 4 others in one day.  He also has dyskinesia of the left foot.  Wife also  describes inappropriate behaviors.  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  He backed out of the race because of that.  Patient took me to the side and stated that there has been marital stress.  Current prescribed movement disorder medications: Carbidopa/levodopa 25/100, 2/1/1 Pramipexole 0.5 mg 3 times per day   ALLERGIES:   Allergies  Allergen Reactions  . Clarithromycin Hypertension  . Hydrocodone-Homatropine Nausea And Vomiting  . Liraglutide Nausea And Vomiting    CURRENT MEDICATIONS:  Outpatient Encounter Medications as of 07/09/2020  Medication Sig  . aspirin EC 81 MG tablet Take 81 mg by mouth daily.  . carbidopa-levodopa (SINEMET) 25-100 MG tablet Take 2 tablets by mouth 3 (three) times daily.  . Exenatide ER (BYDUREON BCISE) 2 MG/0.85ML AUIJ Inject 2 mg into the skin once a week.   . Insulin Glargine (TOUJEO SOLOSTAR) 300 UNIT/ML SOPN Inject 52 Units into the skin at bedtime.   . metFORMIN (GLUCOPHAGE) 500 MG tablet Take 1,000 mg by mouth 2 (two) times daily.  Marland Kitchen oxyCODONE (OXY IR/ROXICODONE) 5 MG immediate release tablet Take 1 tablet (5 mg total) by mouth every 6 (six) hours as needed for moderate pain.  . pantoprazole (PROTONIX) 40 MG tablet Take 40 mg by mouth daily.  Marland Kitchen  PARoxetine (PAXIL) 30 MG tablet Take 30 mg by mouth daily.  . pramipexole (MIRAPEX) 0.5 MG tablet Take 1 tablet (0.5 mg total) by mouth 3 (three) times daily.  . ramipril (ALTACE) 10 MG capsule Take 10 mg by mouth daily.  . traZODone (DESYREL) 50 MG tablet Take 50 mg by mouth at bedtime.  . [DISCONTINUED] carbidopa-levodopa (SINEMET) 25-100 MG tablet Take 2 tablets by mouth 3 (three) times daily. (Patient taking differently: Take 1 tablet by mouth 3 (three) times daily. )  . [DISCONTINUED] meloxicam (MOBIC) 15 MG tablet Take 15 mg by mouth daily.  (Patient not taking: Reported on 07/09/2020)  . [DISCONTINUED]  oxyCODONE-acetaminophen (PERCOCET) 10-325 MG tablet Take 1 tablet by mouth every 6 (six) hours as needed for pain. (Patient not taking: Reported on 07/09/2020)  . [DISCONTINUED] solifenacin (VESICARE) 5 MG tablet Take 5 mg by mouth daily. (Patient not taking: Reported on 07/09/2020)   No facility-administered encounter medications on file as of 07/09/2020.    Objective:   PHYSICAL EXAMINATION:    VITALS:   Vitals:   07/09/20 1404  BP: 125/85  Pulse: 94  SpO2: 95%  Weight: 224 lb (101.6 kg)  Height: 5\' 10"  (1.778 m)    GEN:  The patient appears stated age and is in NAD. HEENT:  Normocephalic, atraumatic.  The mucous membranes are moist. The superficial temporal arteries are without ropiness or tenderness. CV:  RRR Lungs:  CTAB Neck/HEME:  There are no carotid bruits bilaterally.  Neurological examination:  Orientation: The patient is alert and oriented x3. Cranial nerves: There is good facial symmetry with facial hypomimia. The speech is fluent and clear. Soft palate rises symmetrically and there is no tongue deviation. Hearing is intact to conversational tone. Sensation: Sensation is intact to light touch throughout Motor: Strength is at least antigravity x4.  Movement examination: Tone: There is normal tone in the UE/LE Abnormal movements: there is mild dyskinesia of the L foot Coordination:  There is mild decremation with RAM's, with finger taps bilaterally Gait and Station: The patient has no difficulty arising out of a deep-seated chair without the use of the hands. The patient's stride length is pretty good, although he states that he walks better with his cane than without it.    I have reviewed and interpreted the following labs independently    Chemistry      Component Value Date/Time   NA 134 (L) 12/29/2019 0625   K 4.2 12/29/2019 0625   CL 98 12/29/2019 0625   CO2 26 12/29/2019 0625   BUN 17 12/29/2019 0625   CREATININE 1.04 12/29/2019 0625      Component Value  Date/Time   CALCIUM 9.2 12/29/2019 0625   ALKPHOS 43 12/29/2019 0625   AST 37 12/29/2019 0625   ALT 60 (H) 12/29/2019 0625   BILITOT 1.0 12/29/2019 0625       Lab Results  Component Value Date   WBC 5.6 12/29/2019   HGB 12.5 (L) 12/29/2019   HCT 39.7 12/29/2019   MCV 77.5 (L) 12/29/2019   PLT 241 12/29/2019    No results found for: TSH   Total time spent on today's visit was 45 minutes, including both face-to-face time and nonface-to-face time.  Time included that spent on review of records (prior notes available to me/labs/imaging if pertinent), discussing treatment and goals, answering patient's questions and coordinating care.  This did not include the 25 minutes of DBS time.  Cc:  Ma Rings, MD

## 2020-06-29 ENCOUNTER — Encounter: Payer: Medicare Other | Admitting: Neurology

## 2020-07-06 ENCOUNTER — Other Ambulatory Visit: Payer: Self-pay

## 2020-07-06 MED ORDER — CARBIDOPA-LEVODOPA 25-100 MG PO TABS: 2.0000 | ORAL_TABLET | Freq: Three times a day (TID) | ORAL | 0 refills | Status: DC

## 2020-07-09 ENCOUNTER — Encounter: Payer: Self-pay | Admitting: Neurology

## 2020-07-09 ENCOUNTER — Other Ambulatory Visit: Payer: Self-pay

## 2020-07-09 ENCOUNTER — Ambulatory Visit (INDEPENDENT_AMBULATORY_CARE_PROVIDER_SITE_OTHER): Payer: Medicare Other | Admitting: Neurology

## 2020-07-09 DIAGNOSIS — G20 Parkinson's disease: Secondary | ICD-10-CM | POA: Diagnosis not present

## 2020-07-09 NOTE — Addendum Note (Signed)
Addended by: Ulice Brilliant T on: 07/09/2020 05:01 PM   Modules accepted: Orders

## 2020-07-09 NOTE — Procedures (Signed)
DBS Programming was performed.    Manufacturer of DBS device: [employer]  Total time spent programming was 25 minutes.  Device was confirmed to be on.  Soft start was confirmed to be on.  Impedences were checked and were within normal limits.  Battery was checked and was determined to be functioning normally and not near the end of life.  Final settings were as follows:    Active Contact Amplitude (mA) PW (ms) Frequency (hz) Side Effects  Left Brain       n/a                            Right Brain       07/09/20 1-(30%)2/3/4 (23% each)- C+ 3.5 60 130            Prior settings: 01/30/20 1-(80%)2-(8%)3-(6%)4-(6%) 3.7 60 130   01/23/20 1-(70%)2/3/4(10% each)-C+ 3.7 60 130   01/23/20 monopolar review 2/3/4(33% each)-C+ 4.0 60 130 More tremor; numb elbow; dystonic hand  Monopolar review 5/6/7 (33% each)-C+ 3.9 60 130 More tremor; numb elbow; arm heavy  Monopolar review 8-C+ 3.9 60 130 Significant tremor

## 2020-07-09 NOTE — Patient Instructions (Signed)
In 3-4 days, decrease pramipexole to 0.5 mg, 1/2 in the AM and then 1 in the afternoon and 1 in the evening for a week, and then decrease pramipexole to 0.5 mg, 1/2 in the AM and 1/2 in the afternoon and 1 in the evening for a week and then decrease pramipexole 0.5 mg, 1/2 tablet three times per day.  The physicians and staff at [facility] are committed to providing excellent care. You may receive a survey requesting feedback about your experience at our office. We strive to receive "very good" responses to the survey questions. If you feel that your experience would prevent you from giving the office a "very good " response, please contact our office to try to remedy the situation. We may be reached at [phone]. Thank you for taking the time out of your busy day to complete the survey.

## 2020-07-11 ENCOUNTER — Telehealth: Payer: Self-pay

## 2020-07-11 DIAGNOSIS — G20 Parkinson's disease: Secondary | ICD-10-CM

## 2020-07-11 NOTE — Telephone Encounter (Signed)
Spoke with patients spouse and informed her that [facility] will not be accepting any patients that do not see a [facility] provider or inhouse provider. She voiced understanding and suggested we try [facility].   I contacted [facility] and left a message requesting them to call me back with their fax number.

## 2020-07-11 NOTE — Telephone Encounter (Signed)
Patient was seen in office on Monday. Dr Tat ordered physical therapy. Patient requested to go to [facility] in [place], [place].   I called the [facility] office on Monday afternoon, but no answer and I did not leave a message.  I called again on Tuesday morning and left a message requesting their fax number to fax over order.   I received a call back from the receptionist stating there were not accepting outside referrals.   Spoke with Dr Tat and she stated the patient will have to chose another location.

## 2020-07-12 NOTE — Telephone Encounter (Signed)
[facility] has Parkinsons Disease physical therapists.  Why don't you call them [phone]) and ask how to schedule a patient for Parkinsons PT in a patient who has had DBS and has falls

## 2020-07-12 NOTE — Telephone Encounter (Signed)
Spoke with someone at [facility] and she states they are not taking new patients right now because they are booked up.   Spoke with patients wife and made her aware that her second choice is not accepting new patients at this time. She voiced understanding and stated where ever Dr Tat recommended would be fine and stated [place] is close to them.

## 2020-07-13 NOTE — Telephone Encounter (Signed)
Spoke with [facility] and was informed that they are accepting new patients. Fax number given.  Referral faxed to [facility] physical therapy.

## 2020-07-18 NOTE — Addendum Note (Signed)
Addended by: Ulice Brilliant T on: 07/18/2020 12:42 PM   Modules accepted: Orders

## 2020-07-18 NOTE — Telephone Encounter (Addendum)
Received voicemail from [facility] requesting a new order with [facility] name on it as well as Dr Doristine Devoid signature on the order.    New order placed, signed and faxed back.

## 2020-07-19 ENCOUNTER — Telehealth: Payer: Self-pay | Admitting: Neurology

## 2020-07-19 NOTE — Telephone Encounter (Signed)
Spoke with scheduling dept at [facility] and was informed that the referral that was sent over has been received and the patient will be contacted tomorrow to set up an appointment.   Left message on patients spouse phone number informing that her husband will be contacted tomorrow to schedule a follow up appt.   Advised a call back with any questions or concerns.

## 2020-07-19 NOTE — Telephone Encounter (Signed)
Patient's wife states she left her cell phone at home, if calling today, call her work phone [phone]. She is also aware we have heard from [facility] in regards to the referral.

## 2020-07-19 NOTE — Telephone Encounter (Signed)
Patient's wife hasn't heard about PT referral. Please call.

## 2020-07-26 NOTE — Progress Notes (Signed)
Assessment/Plan:   1.  Parkinsons Disease  -Patient is status post right STN DBS surgery on December 29, 2019.  IPG was placed on January 05, 2020.  -Continue carbidopa/levodopa 25/100, 2/1/1  -DBS adjusted today.    -We will [facility]  -Weaning off of pramipexole and will stop altogether  2.  Insomnia  -On trazodone  3.  Dysphagia  -Declines MBE.  4.  Follow-up in the next 4 months, sooner should new neurologic issues arise  Subjective:   Bill Bowman was seen today in follow up for Parkinsons disease.  My previous records were reviewed prior to todays visit as well as outside records available to me.  Patients pramipexole decreased last visit, due to possible compulsive behavior/inappropriate behavior towards women.  We sent him to physical therapy last visit due to multiple falls.  He reports that they have not scheduled him yet, although they did call him for demographics.  In regards to left foot movement, he reports that it is much better, although not gone.  Overall, he reports that he has done much better since last visit.  He has had no falls since last visit.  Feeling much better.  Compulsive behaviors better, although not gone, but is now directed within marriage.  Current prescribed movement disorder medications: Carbidopa/levodopa 25/100, 2/1/1 Pramipexole 0.5 mg, half tablet 3 times per day   ALLERGIES:   Allergies  Allergen Reactions  . Clarithromycin Hypertension  . Hydrocodone-Homatropine Nausea And Vomiting  . Liraglutide Nausea And Vomiting    CURRENT MEDICATIONS:  Outpatient Encounter Medications as of 07/30/2020  Medication Sig  . aspirin EC 81 MG tablet Take 81 mg by mouth daily.  . carbidopa-levodopa (SINEMET) 25-100 MG tablet Take 2 tablets by mouth 3 (three) times daily.  . Exenatide ER (BYDUREON BCISE) 2 MG/0.85ML AUIJ Inject 2 mg into the skin once a week.   . Insulin Glargine (TOUJEO SOLOSTAR) 300  UNIT/ML SOPN Inject 52 Units into the skin at bedtime.   . metFORMIN (GLUCOPHAGE) 500 MG tablet Take 1,000 mg by mouth 2 (two) times daily.  Marland Kitchen oxyCODONE (OXY IR/ROXICODONE) 5 MG immediate release tablet Take 1 tablet (5 mg total) by mouth every 6 (six) hours as needed for moderate pain.  . pantoprazole (PROTONIX) 40 MG tablet Take 40 mg by mouth daily.  Marland Kitchen PARoxetine (PAXIL) 30 MG tablet Take 30 mg by mouth daily.  . pramipexole (MIRAPEX) 0.5 MG tablet Take 1 tablet (0.5 mg total) by mouth 3 (three) times daily.  . ramipril (ALTACE) 10 MG capsule Take 10 mg by mouth daily.  . traZODone (DESYREL) 50 MG tablet Take 50 mg by mouth at bedtime.  . traMADol (ULTRAM) 50 MG tablet Take 1 tablet by mouth as needed.   No facility-administered encounter medications on file as of 07/30/2020.    Objective:   PHYSICAL EXAMINATION:    VITALS:   Vitals:   07/30/20 1414  BP: 125/87  Pulse: 96  SpO2: 94%  Weight: 218 lb (98.9 kg)  Height: 5\' 10"  (1.778 m)    GEN:  The patient appears stated age and is in NAD.  Much more jovial than last visit HEENT:  Normocephalic, atraumatic.  The mucous membranes are moist. The superficial temporal arteries are without ropiness or tenderness. CV:  RRR Lungs:  CTAB Neck/HEME:  There are no carotid bruits bilaterally.  Neurological examination:  Orientation: The patient is alert and oriented x3. Cranial nerves: There is good facial  symmetry with facial hypomimia. The speech is fluent and clear. Soft palate rises symmetrically and there is no tongue deviation. Hearing is intact to conversational tone. Sensation: Sensation is intact to light touch throughout Motor: Strength is at least antigravity x4.  Movement examination: Tone: There is normal tone in the UE/LE Abnormal movements: there is minimal dyskinesia in the left foot prior to programming.  None seen after programming. Coordination:  There is has fairly good rapid alternating movements today. Gait and  Station: The patient has no difficulty arising out of a deep-seated chair without the use of the hands. The patient's stride length is good with and without the cane today.  I have reviewed and interpreted the following labs independently    Chemistry      Component Value Date/Time   NA 134 (L) 12/29/2019 0625   K 4.2 12/29/2019 0625   CL 98 12/29/2019 0625   CO2 26 12/29/2019 0625   BUN 17 12/29/2019 0625   CREATININE 1.04 12/29/2019 0625      Component Value Date/Time   CALCIUM 9.2 12/29/2019 0625   ALKPHOS 43 12/29/2019 0625   AST 37 12/29/2019 0625   ALT 60 (H) 12/29/2019 0625   BILITOT 1.0 12/29/2019 0625       Lab Results  Component Value Date   WBC 5.6 12/29/2019   HGB 12.5 (L) 12/29/2019   HCT 39.7 12/29/2019   MCV 77.5 (L) 12/29/2019   PLT 241 12/29/2019    No results found for: TSH   Total time spent on today's visit was 30 minutes, including both face-to-face time and nonface-to-face time.  Time included that spent on review of records (prior notes available to me/labs/imaging if pertinent), discussing treatment and goals, answering patient's questions and coordinating care.  This did not include the 25 minutes of DBS time.  Cc:  Ma Rings, MD

## 2020-07-30 ENCOUNTER — Other Ambulatory Visit: Payer: Self-pay

## 2020-07-30 ENCOUNTER — Encounter: Payer: Self-pay | Admitting: Neurology

## 2020-07-30 ENCOUNTER — Ambulatory Visit (INDEPENDENT_AMBULATORY_CARE_PROVIDER_SITE_OTHER): Payer: Medicare Other | Admitting: Neurology

## 2020-07-30 DIAGNOSIS — G20 Parkinson's disease: Secondary | ICD-10-CM

## 2020-07-30 NOTE — Patient Instructions (Signed)
1.  Decrease pramipexole to 0.5 mg, 1/2 tablet twice per day for a week, then 1/2 tablet once a day for a week and then stop the pramipexole

## 2020-07-30 NOTE — Procedures (Signed)
DBS Programming was performed.    Manufacturer of DBS device: [employer]  Total time spent programming was 25 minutes.  Device was confirmed to be on.  Soft start was confirmed to be on.  Impedences were checked and were within normal limits.  Battery was checked and was determined to be functioning normally and not near the end of life.  Final settings were as follows:    Active Contact Amplitude (mA) PW (ms) Frequency (hz) Side Effects  Left Brain       n/a                            Right Brain       07/09/20 1-(30%)2/3/4 (23% each)- C+ 3.5 60 130   07/30/20 1-(10%)2/3/4(30% each)C+ 3.6 60 130     Prior settings: 01/30/20 1-(80%)2-(8%)3-(6%)4-(6%) 3.7 60 130   01/23/20 1-(70%)2/3/4(10% each)-C+ 3.7 60 130   01/23/20 monopolar review 2/3/4(33% each)-C+ 4.0 60 130 More tremor; numb elbow; dystonic hand  Monopolar review 5/6/7 (33% each)-C+ 3.9 60 130 More tremor; numb elbow; arm heavy  Monopolar review 8-C+ 3.9 60 130 Significant tremor

## 2020-08-06 ENCOUNTER — Telehealth: Payer: Self-pay

## 2020-08-06 DIAGNOSIS — G20 Parkinson's disease: Secondary | ICD-10-CM

## 2020-08-06 NOTE — Telephone Encounter (Signed)
Spoke with patients wife who states she spoke with a patient at [facility] in and [place]. She states she was informed that they are accepting new patients.   Informed wife that when I called the office before I was informed they weren't accepting new patients. She states she understood and requested I give them a call one more time to see if something changed.   Left voicemail with [facility] to see if they were accepting new patients. Waiting for a return call.

## 2020-08-06 NOTE — Telephone Encounter (Signed)
Received a call back from [facility] in [place] and was informed that they are accepting new patients.   Referral bring faxed over.

## 2020-08-06 NOTE — Telephone Encounter (Signed)
Received call from patients spouse with information for physical therapy and requesting a call back.

## 2020-09-30 ENCOUNTER — Other Ambulatory Visit: Payer: Self-pay | Admitting: Neurology

## 2020-10-01 NOTE — Telephone Encounter (Signed)
Rx(s) sent to pharmacy electronically.  

## 2020-10-02 MED ORDER — CARBIDOPA-LEVODOPA 25-100 MG PO TABS: ORAL_TABLET | ORAL | 1 refills | Status: DC

## 2020-10-02 NOTE — Addendum Note (Signed)
Addended by: Ulice Brilliant T on: 10/02/2020 11:58 AM   Modules accepted: Orders

## 2020-10-22 ENCOUNTER — Other Ambulatory Visit: Payer: Self-pay | Admitting: Neurology

## 2020-10-22 NOTE — Telephone Encounter (Signed)
Spoke with [facility] to make sure patient did not have any refills on file.   Rx(s) sent to pharmacy electronically.

## 2020-10-26 ENCOUNTER — Telehealth: Payer: Self-pay | Admitting: Neurology

## 2020-10-26 NOTE — Telephone Encounter (Signed)
See 01/23/20 AVS.  At that visit we decreased his carbidopa/levodopa 25/100 from 2 po tid to 2/1/1.  I have seen him several other times and reviewed meds and given a list and gone over that with him and there should have been no confusion (wife with him each time).  Make sure that pramipexole has been changed (should be off of it now).

## 2020-10-26 NOTE — Telephone Encounter (Signed)
Patient's wife called in about the patient's medication. He is taking carbidopa levodopa, they sent a refill on 10/03/20 and it was different from what he normally takes. He had been taking 2 in morning, 2 at lunch, and 2 at night, but the new one was 2 in morning, 1 at lunch, and 1 at night. She had called his family doctor not realizing [name] prescribed and was told to continue as they were previously 2,2, and 2. This has caused them to run out of pills. She wants to find out if the dosage had been changed?

## 2020-10-26 NOTE — Telephone Encounter (Signed)
Spoke with patients spouse and she states the patient has always taken his medication, carbidopa levodopa,  2 tabs tid.   When I reviewed the chart it states 2/1/1.   Wife wants to clarify the correct instructions for medication. She states she has been giving the patient 2 tabs tid and they are running out of medication early.

## 2020-10-26 NOTE — Telephone Encounter (Signed)
Spoke with patient spouse and informed her that the rx had been changed back in February 2021. She voiced understanding.   She also stats the patient has stopped pramipexole.

## 2020-11-26 NOTE — Progress Notes (Signed)
Assessment/Plan:   1.  Parkinsons Disease  -Continue carbidopa/levodopa 25/100, 2/1/1 (told him he could try 1 po qid to see if would help with EDS but think that having poor/irregular sleep schedule is biggest issue)  -Status post right STN DBS with [employer] device on December 29, 2019 with IPG January 05, 2020.  -Discussed regular daily schedule in detail.  He is waking up at all times of the day.  Discussed exactly what this means.  -Discussed that he needs to start exercising safely.  Discussed proper methods of exercise. 2.  Insomnia  -On trazodone  -think that some of the issue is that he is sleeping in the day and not sleeping at night.  Needs to be more active in the day.   Subjective:   Bill Bowman was seen today in follow up for Parkinsons disease.  My previous records were reviewed prior to todays visit as well as outside records available to me.  Pramipexole was stopped altogether and he has done well since this.  I got a call from the patient's wife back in November stating that the patient has always taken carbidopa/levodopa 25/100, 2 tablets 3 times per day.  We went back in his chart and reviewed it thoroughly and in February, 2021 we decreased his levodopa from 2 tablets 3 times per day to 2 tablets in the morning, 1 in the afternoon and 1 in the evening.  Had seen him several other times since then, reviewed his medication list and nothing was said about them taking levodopa differently than I was reporting.  Today, they state that he is taking medication properly now but not always at the right times (he wakes up at variable times from 7am-after 10am). pt has had some falls but they were in the dark in the woods (coming home from deer hunting).  No falling at home as in the past.  He notes occasional dyskinesia.  Physical therapy notes have been reviewed.  Pt denies lightheadedness, near syncope.  No hallucinations.  Not exercising.  Current prescribed  movement disorder medications: Carbidopa/levodopa 25/100, 2/1/1 Pramipexole stopped last visit   ALLERGIES:   Allergies  Allergen Reactions  . Clarithromycin Hypertension  . Hydrocodone-Homatropine Nausea And Vomiting  . Liraglutide Nausea And Vomiting    CURRENT MEDICATIONS:  Outpatient Encounter Medications as of 12/06/2020  Medication Sig  . aspirin EC 81 MG tablet Take 81 mg by mouth daily.  . carbidopa-levodopa (SINEMET IR) 25-100 MG tablet TAKE 2 TABLETS BY MOUTH IN THE MORNING, ONE TABLET IN THE AFTERNOON AND ONE TABLET IN THE EVENING  . Exenatide ER 2 MG/0.85ML AUIJ Inject 2 mg into the skin once a week.   . metFORMIN (GLUCOPHAGE) 500 MG tablet Take 1,000 mg by mouth 2 (two) times daily.  Marland Kitchen oxyCODONE (OXY IR/ROXICODONE) 5 MG immediate release tablet Take 1 tablet (5 mg total) by mouth every 6 (six) hours as needed for moderate pain.  . pantoprazole (PROTONIX) 40 MG tablet Take 40 mg by mouth daily.  Marland Kitchen PARoxetine (PAXIL) 30 MG tablet Take 30 mg by mouth daily.  . ramipril (ALTACE) 10 MG capsule Take 10 mg by mouth daily.  . traMADol (ULTRAM) 50 MG tablet Take 1 tablet by mouth as needed.  . traZODone (DESYREL) 50 MG tablet Take 50 mg by mouth at bedtime.  . Insulin Glargine (TOUJEO SOLOSTAR) 300 UNIT/ML SOPN Inject 52 Units into the skin at bedtime.    No facility-administered encounter medications on file as  of 12/06/2020.    Objective:   PHYSICAL EXAMINATION:    VITALS:   Vitals:   12/06/20 1323  BP: 124/80  Pulse: 90  SpO2: 98%  Weight: 219 lb (99.3 kg)  Height: 5' 8.5" (1.74 m)    GEN:  The patient appears stated age and is in NAD. HEENT:  Normocephalic, atraumatic.  The mucous membranes are moist. The superficial temporal arteries are without ropiness or tenderness. CV:  RRR Lungs:  CTAB Neck/HEME:  There are no carotid bruits bilaterally.  Neurological examination:  Orientation: The patient is alert and oriented x3. Cranial nerves: There is good  facial symmetry with facial hypomimia. The speech is fluent and clear. Soft palate rises symmetrically and there is no tongue deviation. Hearing is intact to conversational tone. Sensation: Sensation is intact to light touch throughout Motor: Strength is at least antigravity x4.  Movement examination: Tone: There is nl tone in the UE/LE Abnormal movements: None seen Coordination:  There is mild decremation with RAM's, with foot taps bilaterally Gait and Station: The patient has mild difficulty arising out of a deep-seated chair without the use of the hands. The patient's stride length is slightly decreased.  He has a wide based gait.    I have reviewed and interpreted the following labs independently    Chemistry      Component Value Date/Time   NA 134 (L) 12/29/2019 0625   K 4.2 12/29/2019 0625   CL 98 12/29/2019 0625   CO2 26 12/29/2019 0625   BUN 17 12/29/2019 0625   CREATININE 1.04 12/29/2019 0625      Component Value Date/Time   CALCIUM 9.2 12/29/2019 0625   ALKPHOS 43 12/29/2019 0625   AST 37 12/29/2019 0625   ALT 60 (H) 12/29/2019 0625   BILITOT 1.0 12/29/2019 0625       Lab Results  Component Value Date   WBC 5.6 12/29/2019   HGB 12.5 (L) 12/29/2019   HCT 39.7 12/29/2019   MCV 77.5 (L) 12/29/2019   PLT 241 12/29/2019    No results found for: TSH   Total time spent on today's visit was 40 minutes, including both face-to-face time and nonface-to-face time.  Time included that spent on review of records (prior notes available to me/labs/imaging if pertinent), discussing treatment and goals, answering patient's questions and coordinating care.  This is independent of DBS time, which is described separately on a procedural note.  Cc:  Ma Rings, MD

## 2020-12-06 ENCOUNTER — Other Ambulatory Visit: Payer: Self-pay

## 2020-12-06 ENCOUNTER — Encounter: Payer: Self-pay | Admitting: Neurology

## 2020-12-06 ENCOUNTER — Ambulatory Visit (INDEPENDENT_AMBULATORY_CARE_PROVIDER_SITE_OTHER): Payer: Medicare Other | Admitting: Neurology

## 2020-12-06 VITALS — BP 124/80 | HR 90 | Ht 68.5 in | Wt 219.0 lb

## 2020-12-06 DIAGNOSIS — G4701 Insomnia due to medical condition: Secondary | ICD-10-CM | POA: Diagnosis not present

## 2020-12-06 DIAGNOSIS — G249 Dystonia, unspecified: Secondary | ICD-10-CM | POA: Diagnosis not present

## 2020-12-06 DIAGNOSIS — G20 Parkinson's disease: Secondary | ICD-10-CM

## 2020-12-06 NOTE — Procedures (Signed)
DBS Programming was performed.    Manufacturer of DBS device: [employer]  Total time spent programming was 24 minutes.  Device was confirmed to be on.  Soft start was confirmed to be on.  Impedences were checked and were within normal limits.  Battery was checked and was determined to be functioning normally and not near the end of life.  Final settings were as follows:    Active Contact Amplitude (mA) PW (ms) Frequency (hz) Side Effects  Left Brain       n/a                            Right Brain       07/09/20 1-(30%)2/3/4 (23% each)- C+ 3.5 60 130   07/30/20 1-(10%)2/3/4(30% each)C+ 3.6 60 130   12/06/20 2/3/4(33%each)-C+ 3.8 (3.1-4.1) 60 130                                 Prior settings: 01/30/20 1-(80%)2-(8%)3-(6%)4-(6%) 3.7 60 130   01/23/20 1-(70%)2/3/4(10% each)-C+ 3.7 60 130   01/23/20 monopolar review 2/3/4(33% each)-C+ 4.0 60 130 More tremor; numb elbow; dystonic hand  Monopolar review 5/6/7 (33% each)-C+ 3.9 60 130 More tremor; numb elbow; arm heavy  Monopolar review 8-C+ 3.9 60 130 Significant tremor

## 2020-12-06 NOTE — Patient Instructions (Signed)
Parkinsons [employer]   . Online Resources for Power over Parkinson's Group . Local [place] Online Groups  o Power over [employer] Group :   - Power Over Parkinson's Patient Education Group will be Wednesday, December 8th at 2pm via Zoom.   - Upcoming Power over Parkinson's Meetings:  2nd Wednesdays of the month at 2 pm:       January 12th, February 9th - Amy Marriott, PT at [facility] has resumed the lead of this group starting in July.  Contact Amy at [email or web address] if interested in participating in this online group o Parkinson's Care Partners Group:    3rd Mondays, Contact Corwin Levins o Atypical Parkinsonian Patient Group:   4th Wednesdays, Contact Corwin Levins o If you are interested in participating in these online groups with Judson Roch, please contact her directly for how to join those meetings.  Her contact information is [email or web address].  She will send you a link to join the [employer].  (Please note that Corwin Levins , MSW, LCSW, has resigned her position at [facility], but will continue to lead the online groups temporarily) .  Marland Kitchen [place]:  [email or web address] o PD Health at Home continues:  Mindfulness Mondays, Expert Briefing Tuesdays, Wellness Wednesdays, Take Time Thursdays, Fitness Fridays  o [employer]:  (Next one is February 2022, stay tuned) o Please check out their website to sign up for emails and see their full online offerings .  Marland Kitchen [place]:  [email or web address]  o Upcoming Webinar:   Stay tuned for 2022 o Check out additional information on their website to see their full online offerings .  Marland Kitchen [place]:  [email or web address] o Upcoming Webinar:  Stay tuned for 2022 o Care Partner Monthly Meetup.  With Robin Searing [place].  First Tuesday of each month, 2 pm o Check out additional information to Live Well Today on their  website .  Marland Kitchen Parkinson and Movement Disorders (PMD) Alliance:  www.[email or web address] o [employer] Online:  Online Education Events o Sign up for emails, which are sent weekly to give you updates on programming and online offerings .  Marland Kitchen Parkinson's Association of the [place]:  www.[email or web address] o Information on online support groups, online exercises including Yoga, Parkinson's exercises and more-LOTS of information on links to PD resources and online events o Virtual Support Group through Parkinson's Association of the [place]; next one is scheduled for Wednesday, November 07, 2020 at 2 pm. (These are typically scheduled for the 1st Wednesday of the month at 2 pm).  Visit website for details. .  . Additional links for movement activities: o PWR! Moves Classes at [place] RESUMED, at a limited capacity.  We have several openings for Wednesday 10 am and 11 am classes.  Contact Amy Marriott, PT [email or web address] or [phone] if interested o Here is a link to the PWR!Moves classes on Zoom from [place] - Daily Mon-Sat at 10:00. Via Zoom, FREE and open to all.  There is also a link below via Facebook if you use that platform. - [email or web address].si - [email or web address].no o Parkinson's Wellness Recovery (PWR! Moves)  www.[email or web address] - Info on the PWR! Virtual Experience:  You will have access to our expertise through self-assessment, guided plans that start with the PD-specific fundamentals, educational content, tips, Q&A with an expert, and a growing Art therapist of PD-specific pre-recorded and live exercise classes of varying types and intensity - both physical and cognitive! If that is not enough, we offer 1:1 wellness  consultations (in-person or virtual) to  personalize your PWR! Dance movement psychotherapist.  - Check out the PWR! Move of the month on the Parkinson Wellness Recovery website:  [email or web address].de o [employer]  Fridays:  - As part of the PD Health @ Home program, this free video series focuses each week on one aspect of fitness designed to support people living with Parkinson's.  -  [email or web address].[email or web address]/ o Dance for PD website is offering free, live-stream classes throughout the week, as well as links to [employer] of classes:  [email or web address] o Hotel manager for Parkinson's Class:  Dance Project of [place]in[place] is back this Fall!  Free offering for people with Parkinson's and care partners; virtual class this Fall. The class will be Wednesdays 4-5pm beginning 10/13.  Classes will run for 9 weeks 10/13-12/15,.  Register below: o [email or web address]  o For more information, contact [phone] or email Allena Napoleon at [email or web address] o Virtual dance and Pilates for Parkinson's classes: Click on the Community Tab> Parkinson's Movement Initiative Tab.  To register for classes and for more information, visit www.[email or web address].co.[email or web address] and click the "community" tab.  o [employer] Parkinson's Cycling Classes  - [facility] [employer]: 1pm on Fridays-Live classes at [facility] [employer] at [email or web address] or [phone]) [employer]: Virtual Classes Mondays and Thursdays (contact Chardon at [email or web address] or [phone]) .  o [employer] - Three levels of classes are offered Tuesdays and Thursdays:  10:30 am,  12 noon & 1:45 pm at [employer]. To observe a class or for  more information, call [phone] or email  [email or web address] . [facility] [employer]: o Financial trader Opportunities:  www.well-[email or web address]/caregiver-education/caregiver-support-group.  You may also contact Loleta Chance at [email or web address] or [phone].   o Caregiver Virtual Event:   [facility] is having a [phone], Thursday, December 9th from 6-7 pm - Contact 06-10-1982 (above) for details o Well-Spring Navigator:  Just1Navigator program, a free service to help individuals and families through the journey of determining care for older adults.  The "Navigator" is a Loleta Chance, Child psychotherapist, who will speak with a prospective client and/or loved ones to provide an assessment of the situation and a set of recommendations for a personalized care plan -- all free of charge, and whether [facility] offers the needed service or not. If the need is not a service we provide, we are well-connected with reputable programs in town that we can refer you to.  www.well-springsolutions.org or to speak with the Navigator, call [phone].

## 2020-12-21 ENCOUNTER — Other Ambulatory Visit: Payer: Self-pay | Admitting: Neurology

## 2021-01-20 ENCOUNTER — Other Ambulatory Visit: Payer: Self-pay | Admitting: Neurology

## 2021-01-21 NOTE — Telephone Encounter (Signed)
Rx(s) sent to pharmacy electronically.  

## 2021-05-20 ENCOUNTER — Telehealth: Payer: Self-pay | Admitting: Neurology

## 2021-05-24 MED ORDER — CARBIDOPA-LEVODOPA 25-100 MG PO TABS: ORAL_TABLET | ORAL | 0 refills | Status: DC

## 2021-05-24 NOTE — Telephone Encounter (Signed)
Pt's wife called in and left a message wanting to find out why the carbidopa-levodopa refill was denied. She says he is out and already has a follow up appointment scheduled for 06/07/21

## 2021-05-24 NOTE — Telephone Encounter (Signed)
Please fill

## 2021-05-24 NOTE — Telephone Encounter (Signed)
Called patients wife at [phone] and informed her that patients prescription carbidopa/levodopa was sent to the pharmacy.  Patients wife thanked me and had no further questions or concerns.

## 2021-06-06 NOTE — Progress Notes (Signed)
Assessment/Plan:   1.  Parkinsons Disease  -Continue carbidopa/levodopa 25/100, 1 po qid.  Move dosages closer together and keep last one away from bedtime - 10am/1pm/4pm/7pm  -Status post right STN DBS with [employer] device on December 29, 2019 with IPG January 05, 2020.  Patient would like to do the left side, but he also does not want to undergo neurocognitive testing.  I told him he would need to do this again, as there has been some concerns about memory change.  Not sure if this is primary mood related or there are some primary neurodegenerative concerns now.  Patient wants to think about it and discuss more next visit.  -Discussed regular daily schedule in detail.  He is waking up at all times of the day.  Discussed exactly what this means.  -Discussed that he needs to start exercising safely.  Discussed proper methods of exercise. 2.  Insomnia  -On trazodone    Subjective:   Bill Bowman was seen today in follow up for Parkinsons disease.  My previous records were reviewed prior to todays visit as well as outside records available to me.  Patient is with his wife who supplements the history.  No falls since last visit.  Last visit, we discussed the importance of regular sleep/wake schedule and also the importance of exercising and taking medication on time.  Reports that it is better.  Saw primary care at the beginning of May.  Was complaining about paresthesias in the feet, felt consistent with diabetic peripheral neuropathy.  Started on gabapentin, 100 mg 3 times per day. Helping in the day but nighttime they "feel like fire."  One fall since our last visit and it was last night.  Was opening a chair in the parking lot and he just kept going with the chair and fell on knee.  Noting R hand tremor.  Wife concerned about memory.  States that it really has not been good since DBS.  Current prescribed movement disorder medications: Carbidopa/levodopa 25/100, 2/1/1 (pt  taking 1 po qid and less sleepy this way but taking last q hs) Trazodone  Prior meds: pramipexole (?compulsive behavior)   ALLERGIES:   Allergies  Allergen Reactions   Clarithromycin Hypertension   Hydrocodone Bit-Homatrop Mbr Nausea And Vomiting   Liraglutide Nausea And Vomiting   Homatropine    Hydrocodone    Morphine     CURRENT MEDICATIONS:  Outpatient Encounter Medications as of 06/07/2021  Medication Sig   aspirin 81 MG chewable tablet    aspirin EC 81 MG tablet Take 81 mg by mouth daily.   carbidopa-levodopa (SINEMET IR) 25-100 MG tablet TAKE 2 TABLETS BY MOUTH IN THE MORNING, TAKE 1 TABLET IN THE AFTERNOON, AND TAKE 1 TABLET IN THE EVENING (Patient taking differently: TAKE 1 TABLETS BY MOUTH IN THE MORNING, TAKE 1 TABLET IN THE AFTERNOON, AND TAKE 1 TABLET IN THE EVENING 1 at hs)   Exenatide ER 2 MG/0.85ML AUIJ Inject 2 mg into the skin once a week.    gabapentin (NEURONTIN) 100 MG capsule Take 1 capsule by mouth three times daily as needed   Insulin Glargine 300 UNIT/ML SOPN Inject 52 Units into the skin at bedtime.    metFORMIN (GLUCOPHAGE) 500 MG tablet Take 1,000 mg by mouth 2 (two) times daily.   oxyCODONE (OXY IR/ROXICODONE) 5 MG immediate release tablet Take 1 tablet (5 mg total) by mouth every 6 (six) hours as needed for moderate pain.   pantoprazole (PROTONIX) 40  MG tablet Take 40 mg by mouth daily.   PARoxetine (PAXIL) 30 MG tablet Take 30 mg by mouth daily.   ramipril (ALTACE) 10 MG capsule Take 10 mg by mouth daily.   traMADol (ULTRAM) 50 MG tablet Take 1 tablet by mouth as needed.   traZODone (DESYREL) 50 MG tablet Take 50 mg by mouth at bedtime.   No facility-administered encounter medications on file as of 06/07/2021.    Objective:   PHYSICAL EXAMINATION:    VITALS:   Vitals:   06/07/21 1445  Pulse: 77  Resp: 18  SpO2: 98%  Weight: 209 lb (94.8 kg)  Height: 5\' 10"  (1.778 m)     GEN:  The patient appears stated age and is in NAD. HEENT:   Normocephalic, atraumatic.  The mucous membranes are moist. The superficial temporal arteries are without ropiness or tenderness. CV:  RRR Lungs:  CTAB Neck/HEME:  There are no carotid bruits bilaterally.  Neurological examination:  Orientation: The patient is alert and oriented x3. Cranial nerves: There is good facial symmetry with facial hypomimia. The speech is fluent and clear. Soft palate rises symmetrically and there is no tongue deviation. Hearing is intact to conversational tone. Sensation: Sensation is intact to light touch throughout Motor: Strength is at least antigravity x4.  Pt seen at 3pm and only took one dose (10am) today.    Movement examination: Tone: There is mild increased tone in the RUE Abnormal movements: there is RUE rest tremor Coordination:  There is mild decremation with RAM's, with foot taps bilaterally Gait and Station: The patient has mild difficulty arising out of a deep-seated chair without the use of the hands. The patient's stride length is slightly decreased.  He has a wide based gait.    I have reviewed and interpreted the following labs independently    Chemistry      Component Value Date/Time   NA 134 (L) 12/29/2019 0625   K 4.2 12/29/2019 0625   CL 98 12/29/2019 0625   CO2 26 12/29/2019 0625   BUN 17 12/29/2019 0625   CREATININE 1.04 12/29/2019 0625      Component Value Date/Time   CALCIUM 9.2 12/29/2019 0625   ALKPHOS 43 12/29/2019 0625   AST 37 12/29/2019 0625   ALT 60 (H) 12/29/2019 0625   BILITOT 1.0 12/29/2019 0625       Lab Results  Component Value Date   WBC 5.6 12/29/2019   HGB 12.5 (L) 12/29/2019   HCT 39.7 12/29/2019   MCV 77.5 (L) 12/29/2019   PLT 241 12/29/2019    No results found for: TSH   Total time spent on today's visit was 40 minutes, including both face-to-face time and nonface-to-face time.  Time included that spent on review of records (prior notes available to me/labs/imaging if pertinent), discussing  treatment and goals, answering patient's questions and coordinating care.  This is independent of DBS time, which is described separately on a procedural note.  This did not include DBS time.  Cc:  Ma Rings, MD

## 2021-06-07 ENCOUNTER — Other Ambulatory Visit: Payer: Self-pay

## 2021-06-07 ENCOUNTER — Ambulatory Visit (INDEPENDENT_AMBULATORY_CARE_PROVIDER_SITE_OTHER): Payer: Medicare Other | Admitting: Neurology

## 2021-06-07 ENCOUNTER — Encounter: Payer: Self-pay | Admitting: Neurology

## 2021-06-07 DIAGNOSIS — G20 Parkinson's disease: Secondary | ICD-10-CM

## 2021-06-07 MED ORDER — CARBIDOPA-LEVODOPA 25-100 MG PO TABS: 1.0000 | ORAL_TABLET | Freq: Four times a day (QID) | ORAL | 2 refills | Status: DC

## 2021-06-07 NOTE — Procedures (Signed)
DBS Programming was performed.    Manufacturer of DBS device: [employer]  Total time spent programming was 10 minutes.  Device was confirmed to be on.  Soft start was confirmed to be on.  Impedences were checked and were within normal limits.  Battery was checked and was determined to be functioning normally and not near the end of life.  Final settings were as follows:    Active Contact Amplitude (mA) PW (ms) Frequency (hz) Side Effects  Left Brain       n/a                            Right Brain       07/09/20 1-(30%)2/3/4 (23% each)- C+ 3.5 60 130   07/30/20 1-(10%)2/3/4(30% each)C+ 3.6 60 130   12/06/20 2/3/4(33%each)-C+ 3.8 (3.1-4.1) 60 130   06/07/21 2/3/4(33%each)-C+ 3.8 60 130                          Prior settings: 01/30/20 1-(80%)2-(8%)3-(6%)4-(6%) 3.7 60 130    01/23/20 1-(70%)2/3/4(10% each)-C+ 3.7 60 130    01/23/20 monopolar review 2/3/4(33% each)-C+ 4.0 60 130 More tremor; numb elbow; dystonic hand  Monopolar review 5/6/7 (33% each)-C+ 3.9 60 130 More tremor; numb elbow; arm heavy  Monopolar review 8-C+ 3.9 60 130 Significant tremor

## 2021-10-13 IMAGING — CT CT HEAD W/O CM
3 of 4 series · 14 of 47 positions shown, 16 images · non-contrast
Comparison: MR brain without and with contrast 09/22/2019

CLINICAL DATA: Parkinson's disease. Deep brain stimulator
placement.

EXAM:
CT HEAD WITHOUT CONTRAST
TECHNIQUE: Contiguous axial images were obtained from the base of the skull
through the vertex without intravenous contrast.

[Series 5: head 3.0 mpr cor · coronal · 0.38mm/px · 3 of 77 slices shown]
[im 26/77  brain]
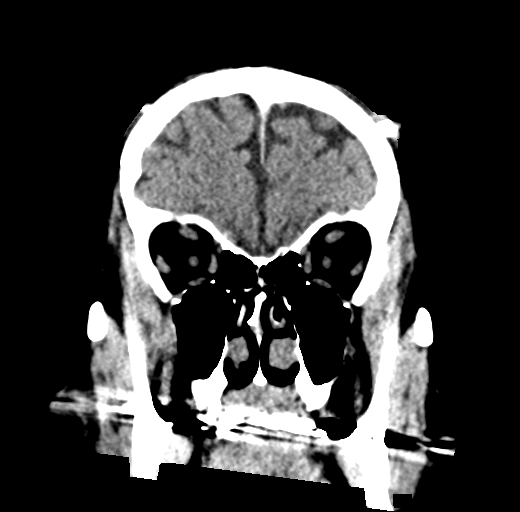
[im 34/77  brain]
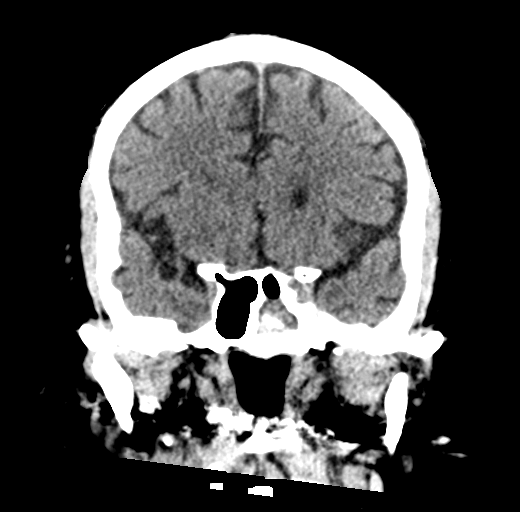
[im 43/77  brain]
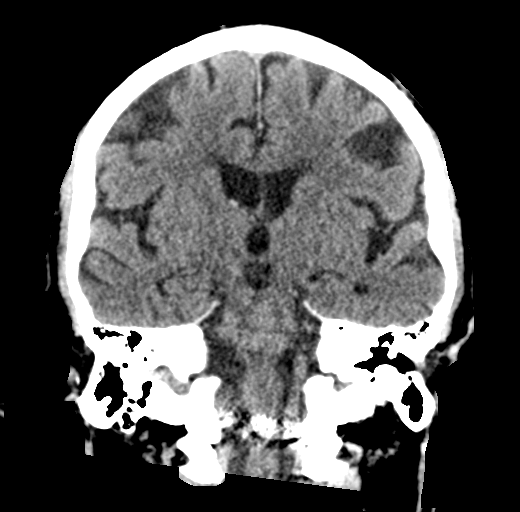

[Series 6: head 3.0 mpr sag · sagittal · 0.37mm/px · 3 of 67 slices shown]
[im 25/67  brain]
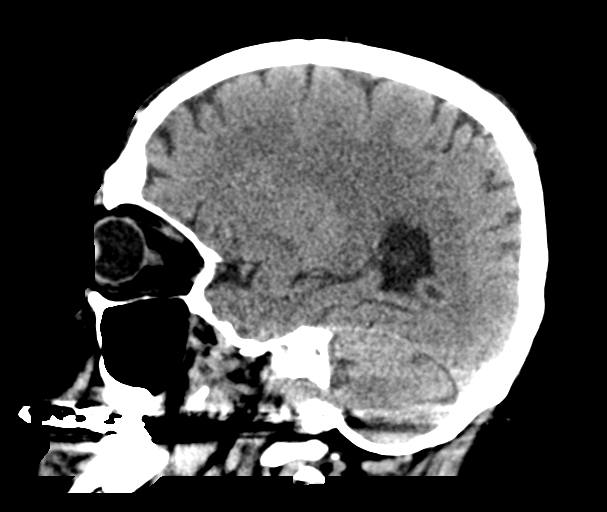
[im 34/67  brain]
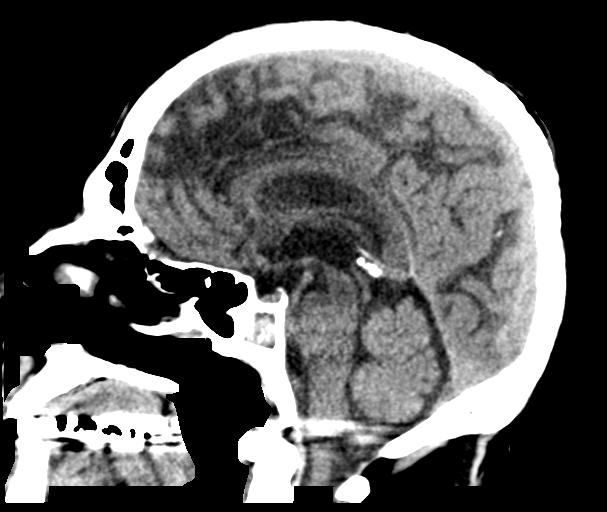
[im 42/67  brain]
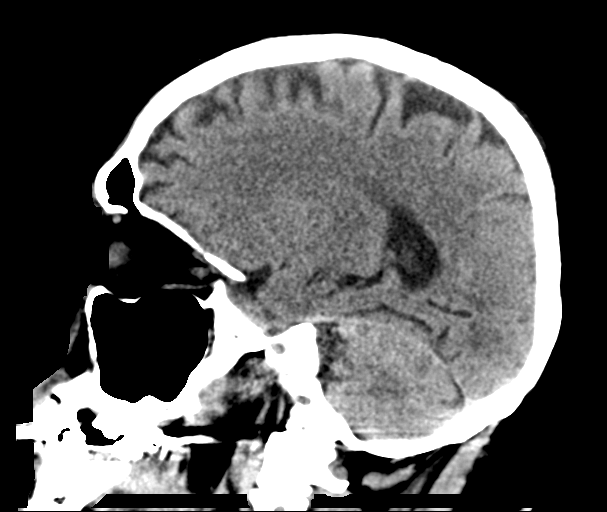

[Series 7: head 0.75 mpr ax · axial · 0.40mm/px · z∈[-120,+41]mm · 8 of 276 slices shown, 10 images]
[im 23/276  brain]
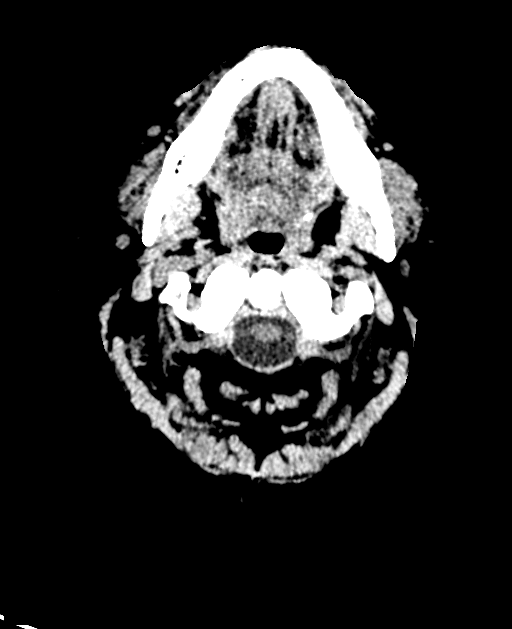
[im 23/276  bone]
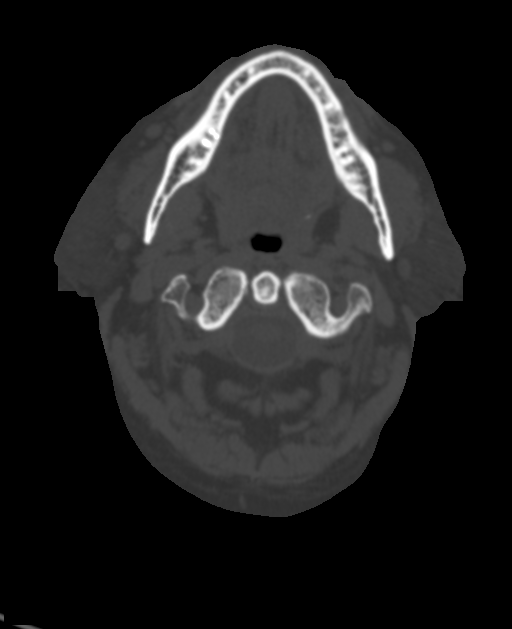
[im 56/276  brain]
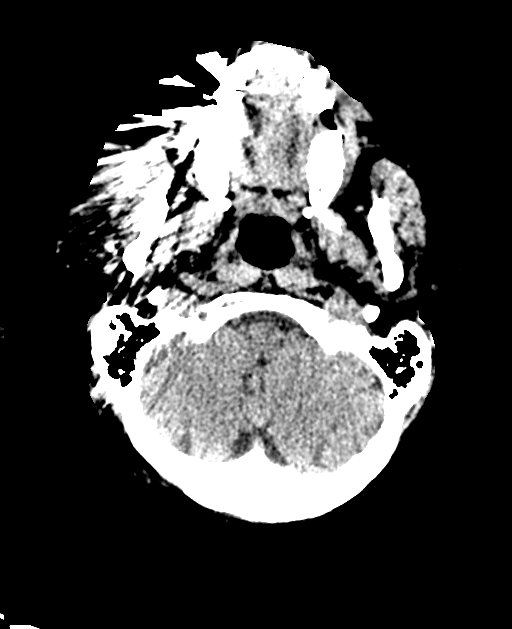
[im 89/276  brain]
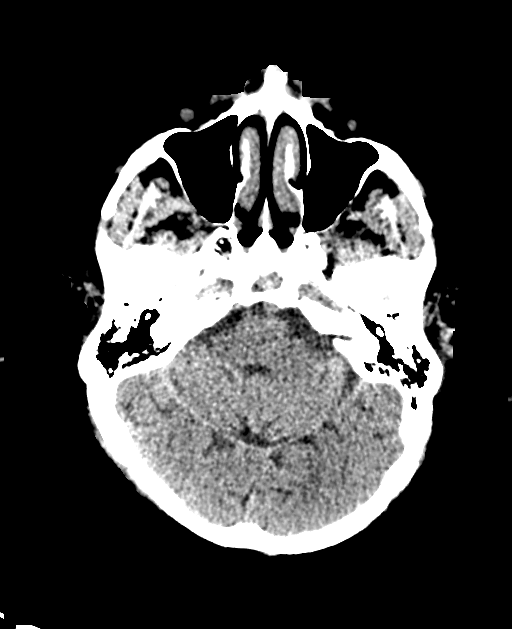
[im 122/276  brain]
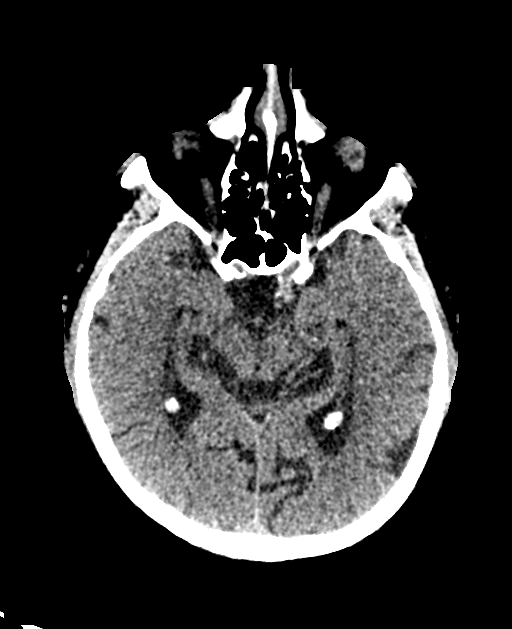
[im 155/276  brain]
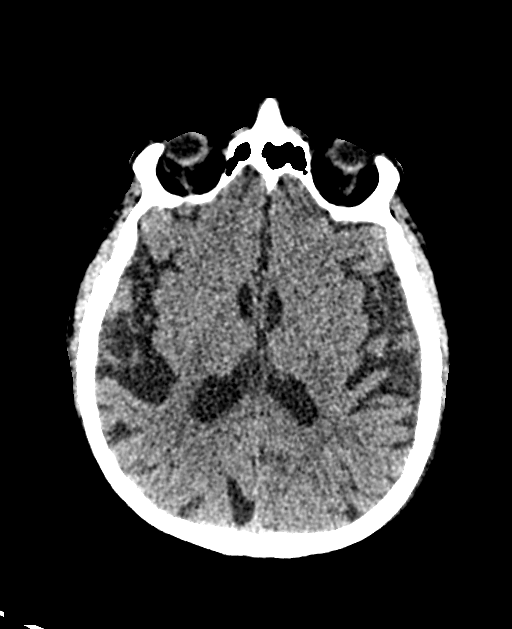
[im 155/276  bone]
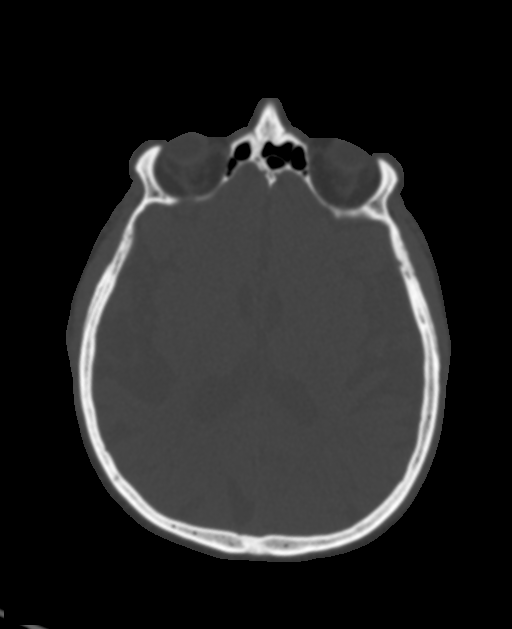
[im 188/276  brain]
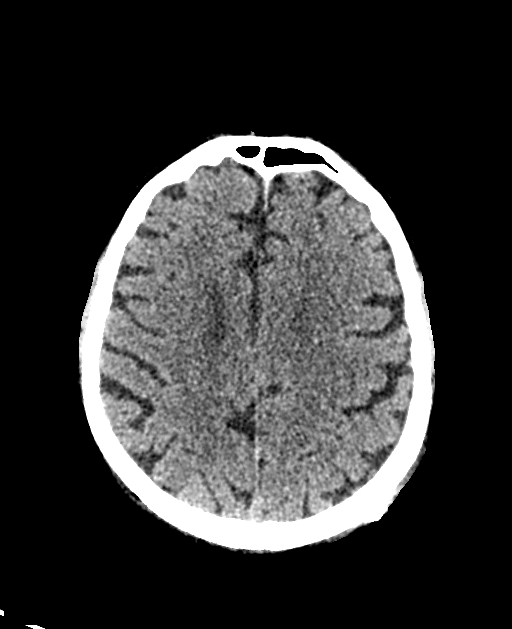
[im 221/276  brain]
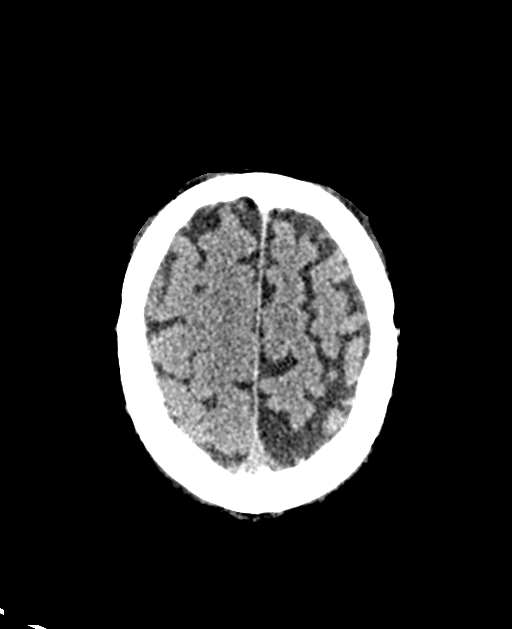
[im 254/276  brain]
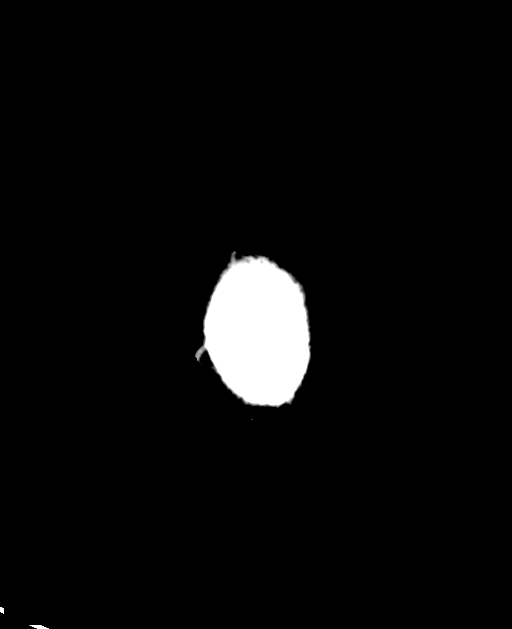

[14 of 47 positions shown; findings below may reference images not displayed]

FINDINGS: Brain: Mild atrophy and white matter changes are stable, likely
within normal limits for age. No acute infarct, hemorrhage, or mass
lesion is present. The ventricles are of normal size. No significant
extraaxial fluid collection is present.

Vascular: Minimal atherosclerotic changes are present right
cavernous internal carotid artery. There is no hyperdense vessel.

Skull: Calvarium is intact. No focal lytic or blastic lesions are
present. Fiduciary markers are in place. Soft tissue swelling is
present about the markers. There is no associated fracture

Sinuses/Orbits: The paranasal sinuses and mastoid air cells are
clear. The globes and orbits are within normal limits.
IMPRESSION: 1. Normal CT appearance of the brain for age.
2. CT head for localization purposes prior to deep brain stimulator
placement.
3. Fiduciary markers noted without complication.

## 2021-12-13 NOTE — Progress Notes (Signed)
Assessment/Plan:   1.  Parkinsons Disease  -change carbidopa/levodopa 25/100 to tid due to compliance.  He will take carbidopa/levodopa 25/100, 2 at 10 am (wake up time), 1 at 1-2pm, 1 at 6pm instead of 1 tablet 4 times per day.  -Status post right STN DBS with [employer] device on December 29, 2019 with IPG January 05, 2020.  Patient would like to do the left side, but he also does not want to undergo neurocognitive testing.  I told him he would need to do this again, as there has been some concerns about memory change.  Not sure if this is primary mood related or there are some primary neurodegenerative concerns now.  Patient wants to think about it and discuss more next visit.  -Discussed again the importance of exercise, but patient admits he is not going to do this. 2.  Insomnia  -On trazodone 3.  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   Subjective:   Bill Bowman was seen today in follow up for Parkinsons disease.  My previous records were reviewed prior to todays visit as well as outside records available to me.  Patient is with his wife who supplements the history.   Had eye surgery on September 13.  Notes reviewed.  Gave his deer hunting up due to hunting in the woods.  Fell at sons house few months ago - tripped on sofa and fell.  Some increase in RUE tremor.  Wife thinks that motor wise he is doing pretty good (pt not so sure about that) but has periods of mental confusion.  States that PCP dx him with PN and he is taking gabapentin.  Charging one day a week and takes anywhere b/w 10-61min.    Current prescribed movement disorder medications: Carbidopa/levodopa 25/100, 1 tablet 4 times per day (told last visit to take it at 10 AM/1 PM/4 PM/7 PM) - many days missing some or moves the last to qhs.  Wife works and he misses some medication Trazodone  Prior meds: pramipexole (?compulsive behavior)   ALLERGIES:   Allergies  Allergen  Reactions   Clarithromycin Hypertension   Hydrocodone Bit-Homatrop Mbr Nausea And Vomiting   Liraglutide Nausea And Vomiting   Homatropine    Hydrocodone    Morphine     CURRENT MEDICATIONS:  Outpatient Encounter Medications as of 12/16/2021  Medication Sig   aspirin 81 MG chewable tablet    aspirin EC 81 MG tablet Take 81 mg by mouth daily.   carbidopa-levodopa (SINEMET IR) 25-100 MG tablet Take 1 tablet by mouth 4 (four) times daily. 10am/1pm/4pm/7pm   Exenatide ER 2 MG/0.85ML AUIJ Inject 2 mg into the skin once a week.    gabapentin (NEURONTIN) 100 MG capsule Take 1 capsule by mouth three times daily as needed   Insulin Glargine 300 UNIT/ML SOPN Inject 52 Units into the skin at bedtime.    metFORMIN (GLUCOPHAGE) 500 MG tablet Take 1,000 mg by mouth 2 (two) times daily.   oxyCODONE (OXY IR/ROXICODONE) 5 MG immediate release tablet Take 1 tablet (5 mg total) by mouth every 6 (six) hours as needed for moderate pain.   pantoprazole (PROTONIX) 40 MG tablet Take 40 mg by mouth daily.   PARoxetine (PAXIL) 30 MG tablet Take 30 mg by mouth daily.   ramipril (ALTACE) 10 MG capsule Take 10 mg by mouth daily.   traMADol (ULTRAM) 50 MG tablet Take 1 tablet by mouth as needed.   traZODone (DESYREL)  50 MG tablet Take 50 mg by mouth at bedtime.   No facility-administered encounter medications on file as of 12/16/2021.    Objective:   PHYSICAL EXAMINATION:    VITALS:   Vitals:   12/16/21 1417  BP: 125/90  Pulse: 80  SpO2: 93%  Weight: 211 lb 9.6 oz (96 kg)  Height: 5\' 9"  (1.753 m)      GEN:  The patient appears stated age and is in NAD. HEENT:  Normocephalic, atraumatic.  The mucous membranes are moist. The superficial temporal arteries are without ropiness or tenderness. CV:  RRR Lungs:  CTAB Neck/HEME:  There are no carotid bruits bilaterally.  Neurological examination:  Orientation: The patient is alert and oriented x3.  He has trouble with the finer aspects of his history and  looks to wife for this. Cranial nerves: There is good facial symmetry with facial hypomimia. The speech is fluent and clear. Soft palate rises symmetrically and there is no tongue deviation. Hearing is intact to conversational tone. Sensation: Sensation is intact to light touch throughout Motor: Strength is at least antigravity x4.   Movement examination: Tone: There is normal tone in the upper and lower extremity. Abnormal movements: there is RUE rest tremor that is mild and intermittent. Coordination:  There is mild decremation with RAM's, with foot taps on the left Gait and Station: The patient has no difficulty arising out of a deep-seated chair without the use of the hands. The patient's stride length is slightly decreased.  He ambulates really well with his cane.  I have reviewed and interpreted the following labs independently    Chemistry      Component Value Date/Time   NA 134 (L) 12/29/2019 0625   K 4.2 12/29/2019 0625   CL 98 12/29/2019 0625   CO2 26 12/29/2019 0625   BUN 17 12/29/2019 0625   CREATININE 1.04 12/29/2019 0625      Component Value Date/Time   CALCIUM 9.2 12/29/2019 0625   ALKPHOS 43 12/29/2019 0625   AST 37 12/29/2019 0625   ALT 60 (H) 12/29/2019 0625   BILITOT 1.0 12/29/2019 0625       Lab Results  Component Value Date   WBC 5.6 12/29/2019   HGB 12.5 (L) 12/29/2019   HCT 39.7 12/29/2019   MCV 77.5 (L) 12/29/2019   PLT 241 12/29/2019    No results found for: TSH   Total time spent on today's visit was 30 minutes, including both face-to-face time and nonface-to-face time.  Time included that spent on review of records (prior notes available to me/labs/imaging if pertinent), discussing treatment and goals, answering patient's questions and coordinating care.  This did not include DBS time.  Cc:  Ma Rings, MD

## 2021-12-16 ENCOUNTER — Ambulatory Visit (INDEPENDENT_AMBULATORY_CARE_PROVIDER_SITE_OTHER): Payer: Medicare Other | Admitting: Neurology

## 2021-12-16 ENCOUNTER — Other Ambulatory Visit: Payer: Self-pay

## 2021-12-16 ENCOUNTER — Encounter: Payer: Self-pay | Admitting: Neurology

## 2021-12-16 VITALS — BP 125/90 | HR 80 | Ht 69.0 in | Wt 211.6 lb

## 2021-12-16 DIAGNOSIS — R413 Other amnesia: Secondary | ICD-10-CM

## 2021-12-16 DIAGNOSIS — G20 Parkinson's disease: Secondary | ICD-10-CM

## 2021-12-16 MED ORDER — CARBIDOPA-LEVODOPA 25-100 MG PO TABS: 1.0000 | ORAL_TABLET | Freq: Four times a day (QID) | ORAL | 1 refills | Status: DC

## 2021-12-16 NOTE — Procedures (Signed)
DBS Programming was performed.    Manufacturer of DBS device: [employer]  Total time spent programming was 8 minutes.  Device was confirmed to be on.  Soft start was confirmed to be on.  Impedences were checked and were within normal limits.  Battery was checked and was determined to be functioning normally and not near the end of life.  Final settings were as follows:    Active Contact Amplitude (mA) PW (ms) Frequency (hz) Side Effects  Left Brain       n/a                            Right Brain       07/09/20 1-(30%)2/3/4 (23% each)- C+ 3.5 60 130   07/30/20 1-(10%)2/3/4(30% each)C+ 3.6 60 130   12/06/20 2/3/4(33%each)-C+ 3.8 (3.1-4.1) 60 130   06/07/21 2/3/4(33%each)-C+ 3.8 60 130   12/16/21 2/3/4(33%each)-C+ 3.8 60 130                   Prior settings: 01/30/20 1-(80%)2-(8%)3-(6%)4-(6%) 3.7 60 130    01/23/20 1-(70%)2/3/4(10% each)-C+ 3.7 60 130    01/23/20 monopolar review 2/3/4(33% each)-C+ 4.0 60 130 More tremor; numb elbow; dystonic hand  Monopolar review 5/6/7 (33% each)-C+ 3.9 60 130 More tremor; numb elbow; arm heavy  Monopolar review 8-C+ 3.9 60 130 Significant tremor

## 2021-12-16 NOTE — Patient Instructions (Signed)
take carbidopa/levodopa 25/100, 2 at 10 am (wake up time), 1 at 1-2pm, 1 at 6pm.    As a reminder, carbidopa/levodopa can be taken at the same time as a carbohydrate, but we like to have you take your pill either 30 minutes before a protein source or 1 hour after as protein can interfere with carbidopa/levodopa absorption.

## 2022-06-09 NOTE — Progress Notes (Signed)
Assessment/Plan:   1.  Parkinsons Disease  -Increase carbidopa/levodopa 25/100, 2/2/2.  Watch for increased dyskinesia in the left leg.  It is very mild today.  -Status post right STN DBS with [employer] device on December 29, 2019 with IPG January 05, 2020.    -Discussed again the importance of exercise, but patient admits he is not going to do this.  -Referral to physical therapy  -Use walker at all times.  -I am going to pursue MRI brain given left ptosis and some facial droop and newer falls  -We will check achR ab given left ptosis  -Discussed driving.  Will send referral to OT at [facility]  2.  Insomnia  -On trazodone 3.  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  Does not want to do neurocognitive testing. 4.  Diabetes mellitus  -Following with endocrinology.  Subjective:   Bill Bowman was seen today in follow up for Parkinsons disease.  My previous records were reviewed prior to todays visit as well as outside records available to me.  Patient is with his wife who supplements the history.   Last visit, we changed the timing of his levodopa.  He is doing better with that.  He wanted to consider DBS on the left STN, but we had told him he would need to do cognitive testing again.  Does not want to pursue right now.  He was in the hospital in February with complaints of chest pain.  Work-up for cardiac etiologies was negative.  His A1c was elevated at 8.3.  However, when he followed up with endocrinology 3 months later, his A1c was down to 6.5!  Those notes are reviewed.  Reports many falls, virtually daily.  He drags the L foot on the hardwood floors and then when he hits the carpet he falls.  They describe festination.  He just got a walker on Friday.  He hasn't fallen with the walker and is struggling with how to use the walker.  No hallucinations.    Current prescribed movement disorder medications: Carbidopa/levodopa 25/100,  2 at 10 am  (wake up time), 1 at 1-2pm, 1 at 6pm (changed last visit from 4 times daily to 3 times daily dosing because patient just could not remember 4 times daily dosing) (currently taking it at 2 at 8:30am/1 at 12:30pm/1 at 4:30pm) Trazodone  Prior meds: pramipexole (?compulsive behavior)   ALLERGIES:   Allergies  Allergen Reactions   Clarithromycin Hypertension   Hydrocodone Bit-Homatrop Mbr Nausea And Vomiting   Liraglutide Nausea And Vomiting   Homatropine    Hydrocodone    Morphine     CURRENT MEDICATIONS:  Outpatient Encounter Medications as of 06/23/2022  Medication Sig   aspirin EC 81 MG tablet Take 81 mg by mouth daily.   carbidopa-levodopa (SINEMET IR) 25-100 MG tablet 2 tablets three times per day   Exenatide ER 2 MG/0.85ML AUIJ Inject 2 mg into the skin once a week.    gabapentin (NEURONTIN) 100 MG capsule Take 1 capsule by mouth three times daily as needed   Insulin Glargine 300 UNIT/ML SOPN Inject 52 Units into the skin at bedtime.    metFORMIN (GLUCOPHAGE) 500 MG tablet Take 1,000 mg by mouth 2 (two) times daily.   oxyCODONE (OXY IR/ROXICODONE) 5 MG immediate release tablet Take 1 tablet (5 mg total) by mouth every 6 (six) hours as needed for moderate pain.   pantoprazole (PROTONIX) 40 MG tablet Take 40 mg by mouth  daily.   PARoxetine (PAXIL) 30 MG tablet Take 30 mg by mouth daily.   ramipril (ALTACE) 10 MG capsule Take 10 mg by mouth daily.   traMADol (ULTRAM) 50 MG tablet Take 1 tablet by mouth as needed.   traZODone (DESYREL) 50 MG tablet Take 50 mg by mouth at bedtime.   [DISCONTINUED] carbidopa-levodopa (SINEMET IR) 25-100 MG tablet Take 1 tablet by mouth 4 (four) times daily. 2 at 10 am,1 at 1-2pm, 1 at 6pm. (Patient taking differently: Take 1 tablet by mouth 3 (three) times daily. 2 at 10 am,1 at 1-2pm, 1 at 6pm.)   aspirin 81 MG chewable tablet  (Patient not taking: Reported on 06/23/2022)   No facility-administered encounter medications on file as of 06/23/2022.     Objective:   PHYSICAL EXAMINATION:    VITALS:   Vitals:   06/23/22 1412  BP: 118/88  Pulse: 75  SpO2: 95%  Weight: 207 lb (93.9 kg)  Height: '5\' 9"'$  (1.753 m)       GEN:  The patient appears stated age and is in NAD. HEENT:  Normocephalic, atraumatic.  The mucous membranes are moist. The superficial temporal arteries are without ropiness or tenderness. CV:  RRR Lungs:  CTAB Neck/HEME:  There are no carotid bruits bilaterally.  Neurological examination:  Orientation: The patient is alert and oriented x3.  He has trouble with the finer aspects of his history and looks to wife for this. Cranial nerves: There is facial hypomimia.   There is L ptosis.  The speech is fluent and clear. Soft palate rises symmetrically and there is no tongue deviation. Hearing is intact to conversational tone. Sensation: Sensation is intact to light touch throughout Motor: Strength is at least antigravity x4.   Movement examination: Tone: There is normal tone in the upper and lower extremity. Abnormal movements: there is L leg dyskinesia that is mild Coordination:  There is mild decremation with RAM's, with foot taps on the left Gait and Station: The patient pushes off of the chair to arise.  Stride length is slightly decreased.  I have reviewed and interpreted the following labs independently    Chemistry      Component Value Date/Time   NA 134 (L) 12/29/2019 0625   K 4.2 12/29/2019 0625   CL 98 12/29/2019 0625   CO2 26 12/29/2019 0625   BUN 17 12/29/2019 0625   CREATININE 1.04 12/29/2019 0625      Component Value Date/Time   CALCIUM 9.2 12/29/2019 0625   ALKPHOS 43 12/29/2019 0625   AST 37 12/29/2019 0625   ALT 60 (H) 12/29/2019 0625   BILITOT 1.0 12/29/2019 0625       Lab Results  Component Value Date   WBC 5.6 12/29/2019   HGB 12.5 (L) 12/29/2019   HCT 39.7 12/29/2019   MCV 77.5 (L) 12/29/2019   PLT 241 12/29/2019    No results found for: "TSH"   Total time spent  on today's visit was 41 minutes, including both face-to-face time and nonface-to-face time.  Time included that spent on review of records (prior notes available to me/labs/imaging if pertinent), discussing treatment and goals, answering patient's questions and coordinating care.  This did not include DBS time.  Cc:  Ma Rings, MD

## 2022-06-17 ENCOUNTER — Other Ambulatory Visit: Payer: Self-pay | Admitting: Neurology

## 2022-06-18 NOTE — Telephone Encounter (Signed)
Called patient and unable to leave a message due to mailbox being full.

## 2022-06-23 ENCOUNTER — Other Ambulatory Visit: Payer: Medicare Other

## 2022-06-23 ENCOUNTER — Encounter: Payer: Self-pay | Admitting: Neurology

## 2022-06-23 ENCOUNTER — Ambulatory Visit (INDEPENDENT_AMBULATORY_CARE_PROVIDER_SITE_OTHER): Payer: Medicare Other | Admitting: Neurology

## 2022-06-23 VITALS — BP 118/88 | HR 75 | Ht 69.0 in | Wt 207.0 lb

## 2022-06-23 DIAGNOSIS — G20 Parkinson's disease: Secondary | ICD-10-CM | POA: Diagnosis not present

## 2022-06-23 DIAGNOSIS — H02402 Unspecified ptosis of left eyelid: Secondary | ICD-10-CM

## 2022-06-23 DIAGNOSIS — R296 Repeated falls: Secondary | ICD-10-CM

## 2022-06-23 MED ORDER — CARBIDOPA-LEVODOPA 25-100 MG PO TABS: ORAL_TABLET | ORAL | 1 refills | Status: DC

## 2022-06-23 NOTE — Procedures (Signed)
DBS Programming was performed.    Manufacturer of DBS device: [employer] without bluetooth  Total time spent programming was 10 minutes.  Device was confirmed to be on.  Soft start was confirmed to be on.  Impedences were checked and were within normal limits.  Battery was checked and was determined to be functioning normally and not near the end of life.  Final settings were as follows:    Active Contact Amplitude (mA) PW (ms) Frequency (hz) Side Effects  Left Brain       n/a                            Right Brain       07/09/20 1-(30%)2/3/4 (23% each)- C+ 3.5 60 130   07/30/20 1-(10%)2/3/4(30% each)C+ 3.6 60 130   12/06/20 2/3/4(33%each)-C+ 3.8 (3.1-4.1) 60 130   06/07/21 2/3/4(33%each)-C+ 3.8 60 130   12/16/21 2/3/4(33%each)-C+ 3.8 60 130   06/23/22 2/3/4(33%each)-C+ 3.8 60 130            Prior settings: 01/30/20 1-(80%)2-(8%)3-(6%)4-(6%) 3.7 60 130    01/23/20 1-(70%)2/3/4(10% each)-C+ 3.7 60 130    01/23/20 monopolar review 2/3/4(33% each)-C+ 4.0 60 130 More tremor; numb elbow; dystonic hand  Monopolar review 5/6/7 (33% each)-C+ 3.9 60 130 More tremor; numb elbow; arm heavy  Monopolar review 8-C+ 3.9 60 130 Significant tremor

## 2022-06-24 ENCOUNTER — Telehealth: Payer: Self-pay

## 2022-06-24 DIAGNOSIS — G20 Parkinson's disease: Secondary | ICD-10-CM

## 2022-06-24 NOTE — Telephone Encounter (Signed)
-----   Message from [place], DO sent at 06/23/2022  4:28 PM EDT ----- Forgot to tell you, he needs referral to PT in [place] at [facility].  Dx:  Parkinsons Disease.

## 2022-06-25 ENCOUNTER — Telehealth: Payer: Self-pay

## 2022-06-25 NOTE — Telephone Encounter (Signed)
No PA required for patients MRI Brain w/o contrast. Waiting on scheduling to get back to me as we are only able to schedule with Donita Brooks.

## 2022-06-30 LAB — MYASTHENIA GRAVIS PANEL 1
A CHR BINDING ABS: <0.3 nmol/L
STRIATED MUSCLE AB SCREEN: NEGATIVE

## 2022-07-09 ENCOUNTER — Telehealth: Payer: Self-pay | Admitting: Neurology

## 2022-07-09 NOTE — Telephone Encounter (Signed)
Hospital has a note that they have left 2 voice mails pt wife called and given the number to central scheduling so that they can call and get him scheduled for the MRI

## 2022-07-09 NOTE — Telephone Encounter (Signed)
Patients wife said they have not heard back from anyone regarding an appt for an MRI with [facility].

## 2022-08-08 ENCOUNTER — Ambulatory Visit (HOSPITAL_COMMUNITY)
Admission: RE | Admit: 2022-08-08 | Discharge: 2022-08-08 | Disposition: A | Discharge status: Home / Self Care | Payer: Medicare Other | Source: Ambulatory Visit | Attending: Neurology | Admitting: Neurology

## 2022-08-08 DIAGNOSIS — R296 Repeated falls: Secondary | ICD-10-CM

## 2022-08-08 DIAGNOSIS — H02402 Unspecified ptosis of left eyelid: Secondary | ICD-10-CM | POA: Diagnosis present

## 2022-08-25 ENCOUNTER — Telehealth: Payer: Self-pay | Admitting: Neurology

## 2022-08-25 NOTE — Telephone Encounter (Signed)
Pts wife said its been two weeks and have not heard back from anyone regarding Tige's MRI

## 2022-08-26 NOTE — Telephone Encounter (Signed)
Called patients wife and gave results she has no questions at this time

## 2022-12-18 ENCOUNTER — Other Ambulatory Visit: Payer: Self-pay | Admitting: Neurology

## 2022-12-18 DIAGNOSIS — G20A1 Parkinson's disease without dyskinesia, without mention of fluctuations: Secondary | ICD-10-CM
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
## 2022-12-30 ENCOUNTER — Ambulatory Visit (INDEPENDENT_AMBULATORY_CARE_PROVIDER_SITE_OTHER): Payer: Medicare Other | Admitting: Neurology

## 2022-12-30 VITALS — BP 122/82 | HR 63 | Ht 69.0 in | Wt 208.2 lb

## 2022-12-30 DIAGNOSIS — Z9689 Presence of other specified functional implants: Secondary | ICD-10-CM | POA: Diagnosis not present

## 2022-12-30 DIAGNOSIS — G20A2 Parkinson's disease without dyskinesia, with fluctuations: Secondary | ICD-10-CM

## 2022-12-30 DIAGNOSIS — G20A1 Parkinson's disease without dyskinesia, without mention of fluctuations: Secondary | ICD-10-CM | POA: Diagnosis not present

## 2022-12-30 DIAGNOSIS — F0280 Dementia in other diseases classified elsewhere without behavioral disturbance: Secondary | ICD-10-CM | POA: Diagnosis not present

## 2022-12-30 MED ORDER — DONEPEZIL HCL 5 MG PO TABS: 5.0000 mg | ORAL_TABLET | Freq: Every day | ORAL | 0 refills | Status: AC

## 2022-12-30 MED ORDER — DONEPEZIL HCL 10 MG PO TABS: 10.0000 mg | ORAL_TABLET | Freq: Every day | ORAL | 1 refills | Status: AC

## 2022-12-30 NOTE — Patient Instructions (Addendum)
Constipation and Parkinson's disease:  1.Rancho recipe for constipation in Parkinsons Disease:  -1 cup of unprocessed bran (need to get this at [employer], [employer] or similar type of store), 2 cups of applesauce in 1 cup of prune juice 2.  Increase fiber intake (Metamucil,vegetables) 3.  Regular, moderate exercise can be beneficial. 4.  Avoid medications causing constipation, such as medications like antacids with calcium or magnesium 5.  It's okay to take daily Miralax, and taper if stools become too loose or you experience diarrhea 6.  Stool softeners (Colace) can help with chronic constipation and I recommend you take this daily. 7.  Increase water intake.  You should be drinking 1/2 gallon of water a day as long as you have not been diagnosed with congestive heart failure or renal/kidney failure.  This is probably the single greatest thing that you can do to help your constipation.  For memory, you can start donepezil, 5 mg daily for a month and then you will increase to 10 mg daily.

## 2022-12-30 NOTE — Procedures (Signed)
DBS Programming was performed.    Manufacturer of DBS device: [employer] without bluetooth  Total time spent programming was 8 minutes.  Device was confirmed to be on.  Soft start was confirmed to be on.  Impedences were checked and were within normal limits.  Battery was checked and was determined to be functioning normally and not near the end of life.  Final settings were as follows:    Active Contact Amplitude (mA) PW (ms) Frequency (hz) Side Effects  Left Brain       n/a                            Right Brain       07/09/20 1-(30%)2/3/4 (23% each)- C+ 3.5 60 130   07/30/20 1-(10%)2/3/4(30% each)C+ 3.6 60 130   12/06/20 2/3/4(33%each)-C+ 3.8 (3.1-4.1) 60 130   06/07/21 2/3/4(33%each)-C+ 3.8 60 130   12/16/21 2/3/4(33%each)-C+ 3.8 60 130   06/23/22 2/3/4(33%each)-C+ 3.8 60 130   12/30/22 2a/b/c (33%each)C+ 3.8 60 130                   Prior settings: 01/30/20 1-(80%)2-(8%)3-(6%)4-(6%) 3.7 60 130    01/23/20 1-(70%)2/3/4(10% each)-C+ 3.7 60 130    01/23/20 monopolar review 2/3/4(33% each)-C+ 4.0 60 130 More tremor; numb elbow; dystonic hand  Monopolar review 5/6/7 (33% each)-C+ 3.9 60 130 More tremor; numb elbow; arm heavy  Monopolar review 8-C+ 3.9 60 130 Significant tremor

## 2023-02-01 ENCOUNTER — Other Ambulatory Visit: Payer: Self-pay | Admitting: Neurology

## 2023-02-09 ENCOUNTER — Other Ambulatory Visit: Payer: Self-pay | Admitting: Neurology

## 2023-03-18 ENCOUNTER — Other Ambulatory Visit: Payer: Self-pay | Admitting: Neurology

## 2023-03-18 DIAGNOSIS — G20A1 Parkinson's disease without dyskinesia, without mention of fluctuations: Secondary | ICD-10-CM

## 2023-06-13 ENCOUNTER — Other Ambulatory Visit: Payer: Self-pay | Admitting: Neurology

## 2023-06-13 DIAGNOSIS — G20A1 Parkinson's disease without dyskinesia, without mention of fluctuations: Secondary | ICD-10-CM

## 2023-07-02 NOTE — Progress Notes (Signed)
Assessment/Plan:   1.  Parkinsons Disease  - continue carbidopa/levodopa 25/100, 2 at 9am/2 at 1 pm/2 at 5pm.    -add carbidopa/levodopa 50/200 cr at bed for cramping q hs  -Status post right STN DBS with [employer] device on December 29, 2019 with IPG January 05, 2020.  Dbs adjusted today 2.  Insomnia  -On trazodone 3.  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  Does not want to do neurocognitive testing.  -Continue donepezil, 10 mg daily 4.  Diabetes mellitus  -Following with endocrinology.   5.  Left ptosis  -Acetylcholine receptor antibodies were negative as was MRI of the brain. 6.  Constipation   -discussed nature and pathophysiology and association with PD  -discussed importance of hydration.  Pt needs to d/c the soda and increase water.  We discussed last visit but continues to self dehydrate which is making the constipation worse  -pt is given a copy of the rancho recipe again  -recommended daily colace  -recommended miralax prn  -Discussed with patient that exercise can certainly help this  -discussed we could refer him to GI for consideration for something like Linzess but would recommend he trial the increased hydration first.  Can let me know if he would like the referral   Subjective:   Bill Bowman was seen today in follow up for Parkinsons disease.  My previous records were reviewed prior to todays visit as well as outside records available to me.  Patient is with his wife who supplements the history.   Last visit, we added donepezil, 10 mg daily.  He tolerated that well.  Patient was admitted to the hospital at [facility] in February after a fall.  Patient was COVID-positive at the time.  CT brain was done and was negative.  Patient was discharged with home PT.  He did fall 2 weeks ago.  He was hiking and fell on a root.  He does note some tremor of the L leg.  Note that feet cramping at night.  He has been following with his primary  care physician.  He just recently followed up with endocrinology for his diabetes.  A1c was 6.4.  he is c/o constipation.  Only drinking 2 glasses water/day.  Drinks mostly diet soda  Current prescribed movement disorder medications: Carbidopa/levodopa 25/100, 2 tablets 3 times per day  Donepezil, 10 mg daily Trazodone  Prior meds: pramipexole (?compulsive behavior)   ALLERGIES:   Allergies  Allergen Reactions   Clarithromycin Hypertension   Hydrocodone Bit-Homatrop Mbr Nausea And Vomiting   Liraglutide Nausea And Vomiting   Homatropine    Hydrocodone    Morphine     CURRENT MEDICATIONS:  Outpatient Encounter Medications as of 07/06/2023  Medication Sig   aspirin EC 81 MG tablet Take 81 mg by mouth daily.   carbidopa-levodopa (SINEMET CR) 50-200 MG tablet Take 1 tablet by mouth at bedtime.   carbidopa-levodopa (SINEMET IR) 25-100 MG tablet TAKE 2 TABLETS BY MOUTH THREE TIMES DAILY   donepezil (ARICEPT) 10 MG tablet Take 1 tablet (10 mg total) by mouth at bedtime.   donepezil (ARICEPT) 5 MG tablet Take 1 tablet (5 mg total) by mouth at bedtime.   Exenatide ER 2 MG/0.85ML AUIJ Inject 2 mg into the skin once a week.    gabapentin (NEURONTIN) 100 MG capsule Take 1 capsule by mouth three times daily as needed   metFORMIN (GLUCOPHAGE) 500 MG tablet Take 1,000 mg by mouth 2 (two)  times daily.   pantoprazole (PROTONIX) 40 MG tablet Take 40 mg by mouth daily.   PARoxetine (PAXIL) 30 MG tablet Take 30 mg by mouth daily.   ramipril (ALTACE) 10 MG capsule Take 10 mg by mouth daily.   traMADol (ULTRAM) 50 MG tablet Take 1 tablet by mouth as needed.   traZODone (DESYREL) 50 MG tablet Take 50 mg by mouth at bedtime.   [DISCONTINUED] oxyCODONE (OXY IR/ROXICODONE) 5 MG immediate release tablet Take 1 tablet (5 mg total) by mouth every 6 (six) hours as needed for moderate pain.   aspirin 81 MG chewable tablet  (Patient not taking: Reported on 07/06/2023)   Insulin Glargine 300 UNIT/ML SOPN Inject  52 Units into the skin at bedtime.    No facility-administered encounter medications on file as of 07/06/2023.    Objective:   PHYSICAL EXAMINATION:    VITALS:   Vitals:   07/06/23 1302  BP: (!) 120/90  Pulse: 68  SpO2: 96%  Weight: 199 lb (90.3 kg)  Height: 5\' 9"  (1.753 m)     GEN:  The patient appears stated age and is in NAD. HEENT:  Normocephalic, atraumatic.  The mucous membranes are moist. The superficial temporal arteries are without ropiness or tenderness. CV:  RRR Lungs:  CTAB Neck/HEME:  There are no carotid bruits bilaterally.  Neurological examination:  Orientation: The patient is alert and oriented x3.  He has trouble with the finer aspects of his history and looks to wife for this.  This is similar to previous Cranial nerves: There is facial hypomimia.   There is L ptosis.  The speech is fluent and clear. Soft palate rises symmetrically and there is no tongue deviation. Hearing is intact to conversational tone. Sensation: Sensation is intact to light touch throughout Motor: Strength is at least antigravity x4.   Movement examination: Tone: There is normal tone in the upper and lower extremity. Abnormal movements: Rare left leg tremor prior to reprogramming the device. Coordination:  There is mild decremation with RAM's. Gait and Station: The patient pushes off of the chair to arise.  He ambulates fairly well with a walker.  He is forward flexed.  I have reviewed and interpreted the following labs independently    Chemistry      Component Value Date/Time   NA 134 (L) 12/29/2019 0625   K 4.2 12/29/2019 0625   CL 98 12/29/2019 0625   CO2 26 12/29/2019 0625   BUN 17 12/29/2019 0625   CREATININE 1.04 12/29/2019 0625      Component Value Date/Time   CALCIUM 9.2 12/29/2019 0625   ALKPHOS 43 12/29/2019 0625   AST 37 12/29/2019 0625   ALT 60 (H) 12/29/2019 0625   BILITOT 1.0 12/29/2019 0625       Lab Results  Component Value Date   WBC 5.6  12/29/2019   HGB 12.5 (L) 12/29/2019   HCT 39.7 12/29/2019   MCV 77.5 (L) 12/29/2019   PLT 241 12/29/2019    No results found for: "TSH"   Total time spent on today's visit was 30 minutes, including both face-to-face time and nonface-to-face time.  Time included that spent on review of records (prior notes available to me/labs/imaging if pertinent), discussing treatment and goals, answering patient's questions and coordinating care.  This did not include DBS time.  Cc:  Olen Cordial, MD

## 2023-07-06 ENCOUNTER — Ambulatory Visit (INDEPENDENT_AMBULATORY_CARE_PROVIDER_SITE_OTHER): Payer: Medicare Other | Admitting: Neurology

## 2023-07-06 ENCOUNTER — Encounter: Payer: Self-pay | Admitting: Neurology

## 2023-07-06 VITALS — BP 120/90 | HR 68 | Ht 69.0 in | Wt 199.0 lb

## 2023-07-06 DIAGNOSIS — G20A2 Parkinson's disease without dyskinesia, with fluctuations: Secondary | ICD-10-CM | POA: Diagnosis not present

## 2023-07-06 DIAGNOSIS — K5901 Slow transit constipation: Secondary | ICD-10-CM

## 2023-07-06 DIAGNOSIS — Z9689 Presence of other specified functional implants: Secondary | ICD-10-CM | POA: Diagnosis not present

## 2023-07-06 MED ORDER — CARBIDOPA-LEVODOPA ER 50-200 MG PO TBCR: 1.0000 | EXTENDED_RELEASE_TABLET | Freq: Every day | ORAL | 1 refills | Status: DC

## 2023-07-06 NOTE — Patient Instructions (Addendum)
ADD carbidopa/levodopa 50/200 CR at bedtime  Constipation and Parkinson's disease:  1.Rancho recipe for constipation in Parkinsons Disease:  -1 cup of unprocessed bran (need to get this at [employer], [employer] or similar type of store), 2 cups of applesauce in 1 cup of prune juice 2.  Increase fiber intake (Metamucil,vegetables) 3.  Regular, moderate exercise can be beneficial. 4.  Avoid medications causing constipation, such as medications like antacids with calcium or magnesium 5.  It's okay to take daily Miralax, and taper if stools become too loose or you experience diarrhea 6.  Stool softeners (Colace) can help with chronic constipation and I recommend you take this daily. 7.  Increase water intake.  You should be drinking 1/2 gallon of water a day as long as you have not been diagnosed with congestive heart failure or renal/kidney failure.  This is probably the single greatest thing that you can do to help your constipation.   SAVE THE DATE!  We are planning a Parkinsons Disease educational symposium at [facility] in [place] on October 11.  More details to come!  If you would like to be added to our email list to get further information, email [email or web address].  We hope to see you there!

## 2023-07-06 NOTE — Procedures (Signed)
DBS Programming was performed.    Manufacturer of DBS device: [employer] without bluetooth  Total time spent programming was 12 minutes.  Device was confirmed to be on.  Soft start was confirmed to be on.  Impedences were checked and were within normal limits.  Battery was checked and was determined to be functioning normally and not near the end of life.  Final settings were as follows:    Active Contact Amplitude (mA) PW (ms) Frequency (hz) Side Effects  Left Brain       n/a                            Right Brain       07/09/20 1-(30%)2/3/4 (23% each)- C+ 3.5 60 130   07/30/20 1-(10%)2/3/4(30% each)C+ 3.6 60 130   12/06/20 2/3/4(33%each)-C+ 3.8 (3.1-4.1) 60 130   06/07/21 2/3/4(33%each)-C+ 3.8 60 130   12/16/21 2/3/4(33%each)-C+ 3.8 60 130   06/23/22 2/3/4(33%each)-C+ 3.8 60 130   12/30/22 2a/b/c (33%each)C+ 3.8 60 130   07/06/23 2a/b/c (33%each)C+ 3.9 60 139                   Prior settings: 01/30/20 1-(80%)2-(8%)3-(6%)4-(6%) 3.7 60 130    01/23/20 1-(70%)2/3/4(10% each)-C+ 3.7 60 130    01/23/20 monopolar review 2/3/4(33% each)-C+ 4.0 60 130 More tremor; numb elbow; dystonic hand  Monopolar review 5/6/7 (33% each)-C+ 3.9 60 130 More tremor; numb elbow; arm heavy  Monopolar review 8-C+ 3.9 60 130 Significant tremor

## 2023-09-21 ENCOUNTER — Other Ambulatory Visit: Payer: Self-pay | Admitting: Neurology

## 2023-09-21 DIAGNOSIS — G20A1 Parkinson's disease without dyskinesia, without mention of fluctuations: Secondary | ICD-10-CM

## 2023-12-22 ENCOUNTER — Other Ambulatory Visit: Payer: Self-pay | Admitting: Neurology

## 2023-12-22 DIAGNOSIS — G20A1 Parkinson's disease without dyskinesia, without mention of fluctuations: Secondary | ICD-10-CM

## 2023-12-30 ENCOUNTER — Other Ambulatory Visit: Payer: Self-pay | Admitting: Neurology

## 2023-12-30 DIAGNOSIS — G20A2 Parkinson's disease without dyskinesia, with fluctuations: Secondary | ICD-10-CM

## 2024-01-01 NOTE — Progress Notes (Unsigned)
Assessment/Plan:   1.  Parkinsons Disease  - continue carbidopa/levodopa 25/100, 2 at 9am/2 at 1 pm/2 at 5pm.    -Continue carbidopa/levodopa 50/200 cr at bed for cramping q hs  -Status post right STN DBS with [employer] device on December 29, 2019 with IPG January 05, 2020.  Dbs adjusted today 2.  Insomnia  -On trazodone 3.  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  Does not want to do neurocognitive testing.  -Continue donepezil, 10 mg daily 4.  Diabetes mellitus  -Following with endocrinology.   5.  Left ptosis  -Acetylcholine receptor antibodies were negative as was MRI of the brain. 6.  Constipation   -discussed nature and pathophysiology and association with PD  -discussed importance of hydration.  Pt needs to d/c the soda and increase water.  We discussed last visit but continues to self dehydrate which is making the constipation worse  -pt is given a copy of the rancho recipe again  -recommended daily colace  -recommended miralax prn  -Discussed with patient that exercise can certainly help this  -discussed we could refer him to GI for consideration for something like Linzess but would recommend he trial the increased hydration first.  Can let me know if he would like the referral 7.  Overactive bladder  -[facility]  -on vesicare  Subjective:   Bill Bowman was seen today in follow up for Parkinsons disease.  My previous records were reviewed prior to todays visit as well as outside records available to me.  Patient is with his wife who supplements the history.   Last visit, we added bedtime levodopa for nighttime cramping and he reports that ***.  Current prescribed movement disorder medications: Carbidopa/levodopa 25/100, 2 tablets 3 times per day  Carbidopa/levodopa 50/200 at bedtime (added last visit for nighttime cramping) Donepezil, 10 mg daily Trazodone  Prior meds: pramipexole (?compulsive behavior)   ALLERGIES:    Allergies  Allergen Reactions   Clarithromycin Hypertension   Hydrocodone Bit-Homatrop Mbr Nausea And Vomiting   Liraglutide Nausea And Vomiting   Homatropine    Hydrocodone    Morphine     CURRENT MEDICATIONS:  Outpatient Encounter Medications as of 01/04/2024  Medication Sig   aspirin 81 MG chewable tablet  (Patient not taking: Reported on 07/06/2023)   aspirin EC 81 MG tablet Take 81 mg by mouth daily.   carbidopa-levodopa (SINEMET CR) 50-200 MG tablet TAKE 1 TABLET BY MOUTH AT BEDTIME   carbidopa-levodopa (SINEMET IR) 25-100 MG tablet TAKE 2 TABLETS BY MOUTH THREE TIMES DAILY   donepezil (ARICEPT) 10 MG tablet Take 1 tablet (10 mg total) by mouth at bedtime.   donepezil (ARICEPT) 5 MG tablet Take 1 tablet (5 mg total) by mouth at bedtime.   Exenatide ER 2 MG/0.85ML AUIJ Inject 2 mg into the skin once a week.    gabapentin (NEURONTIN) 100 MG capsule Take 1 capsule by mouth three times daily as needed   Insulin Glargine 300 UNIT/ML SOPN Inject 52 Units into the skin at bedtime.    metFORMIN (GLUCOPHAGE) 500 MG tablet Take 1,000 mg by mouth 2 (two) times daily.   pantoprazole (PROTONIX) 40 MG tablet Take 40 mg by mouth daily.   PARoxetine (PAXIL) 30 MG tablet Take 30 mg by mouth daily.   ramipril (ALTACE) 10 MG capsule Take 10 mg by mouth daily.   traMADol (ULTRAM) 50 MG tablet Take 1 tablet by mouth as needed.   traZODone (DESYREL) 50  MG tablet Take 50 mg by mouth at bedtime.   No facility-administered encounter medications on file as of 01/04/2024.    Objective:   PHYSICAL EXAMINATION:    VITALS:   There were no vitals filed for this visit.    GEN:  The patient appears stated age and is in NAD. HEENT:  Normocephalic, atraumatic.  The mucous membranes are moist. The superficial temporal arteries are without ropiness or tenderness. CV:  RRR Lungs:  CTAB Neck/HEME:  There are no carotid bruits bilaterally.  Neurological examination:  Orientation: The patient is alert  and oriented x3.  He has trouble with the finer aspects of his history and looks to wife for this.  This is similar to previous Cranial nerves: There is facial hypomimia.   There is L ptosis.  The speech is fluent and clear. Soft palate rises symmetrically and there is no tongue deviation. Hearing is intact to conversational tone. Sensation: Sensation is intact to light touch throughout Motor: Strength is at least antigravity x4.   Movement examination: Tone: There is normal tone in the upper and lower extremity. Abnormal movements: Rare left leg tremor prior to reprogramming the device. Coordination:  There is mild decremation with RAM's. Gait and Station: The patient pushes off of the chair to arise.  He ambulates fairly well with a walker.  He is forward flexed.  I have reviewed and interpreted the following labs independently    Chemistry      Component Value Date/Time   NA 134 (L) 12/29/2019 0625   K 4.2 12/29/2019 0625   CL 98 12/29/2019 0625   CO2 26 12/29/2019 0625   BUN 17 12/29/2019 0625   CREATININE 1.04 12/29/2019 0625      Component Value Date/Time   CALCIUM 9.2 12/29/2019 0625   ALKPHOS 43 12/29/2019 0625   AST 37 12/29/2019 0625   ALT 60 (H) 12/29/2019 0625   BILITOT 1.0 12/29/2019 0625       Lab Results  Component Value Date   WBC 5.6 12/29/2019   HGB 12.5 (L) 12/29/2019   HCT 39.7 12/29/2019   MCV 77.5 (L) 12/29/2019   PLT 241 12/29/2019    No results found for: "TSH"   Total time spent on today's visit was *** minutes, including both face-to-face time and nonface-to-face time.  Time included that spent on review of records (prior notes available to me/labs/imaging if pertinent), discussing treatment and goals, answering patient's questions and coordinating care.  This did not include DBS time.  Cc:  Olen Cordial, MD

## 2024-01-02 ENCOUNTER — Other Ambulatory Visit: Payer: Self-pay | Admitting: Neurology

## 2024-01-02 DIAGNOSIS — G20A2 Parkinson's disease without dyskinesia, with fluctuations: Secondary | ICD-10-CM

## 2024-01-04 ENCOUNTER — Ambulatory Visit (INDEPENDENT_AMBULATORY_CARE_PROVIDER_SITE_OTHER): Payer: Medicare Other | Admitting: Neurology

## 2024-01-04 ENCOUNTER — Other Ambulatory Visit: Payer: Medicare Other

## 2024-01-04 ENCOUNTER — Encounter: Payer: Self-pay | Admitting: Neurology

## 2024-01-04 VITALS — BP 92/50 | HR 80 | Wt 191.8 lb

## 2024-01-04 DIAGNOSIS — Z9689 Presence of other specified functional implants: Secondary | ICD-10-CM

## 2024-01-04 DIAGNOSIS — Z5181 Encounter for therapeutic drug level monitoring: Secondary | ICD-10-CM | POA: Diagnosis not present

## 2024-01-04 DIAGNOSIS — R6889 Other general symptoms and signs: Secondary | ICD-10-CM

## 2024-01-04 DIAGNOSIS — R296 Repeated falls: Secondary | ICD-10-CM

## 2024-01-04 DIAGNOSIS — R410 Disorientation, unspecified: Secondary | ICD-10-CM

## 2024-01-04 DIAGNOSIS — G20B2 Parkinson's disease with dyskinesia, with fluctuations: Secondary | ICD-10-CM

## 2024-01-04 NOTE — Procedures (Signed)
DBS Programming was performed.    Manufacturer of DBS device: [employer] without bluetooth  Total time spent programming was 10 minutes.  Device was confirmed to be on.  Soft start was confirmed to be on.  Impedences were checked and were within normal limits.  Battery was checked and was determined to be functioning normally and not near the end of life.  Final settings were as follows:    Active Contact Amplitude (mA) PW (ms) Frequency (hz) Side Effects  Left Brain       n/a                            Right Brain       07/09/20 1-(30%)2/3/4 (23% each)- C+ 3.5 60 130   07/30/20 1-(10%)2/3/4(30% each)C+ 3.6 60 130   12/06/20 2/3/4(33%each)-C+ 3.8 (3.1-4.1) 60 130   06/07/21 2/3/4(33%each)-C+ 3.8 60 130   12/16/21 2/3/4(33%each)-C+ 3.8 60 130   06/23/22 2/3/4(33%each)-C+ 3.8 60 130   12/30/22 2a/b/c (33%each)C+ 3.8 60 130   07/06/23 2a/b/c (33%each)C+ 3.9 60 139   01/04/24 2a/b/c (33%each)C+ 3.9 60 132            Prior settings: 01/30/20 1-(80%)2-(8%)3-(6%)4-(6%) 3.7 60 130    01/23/20 1-(70%)2/3/4(10% each)-C+ 3.7 60 130    01/23/20 monopolar review 2/3/4(33% each)-C+ 4.0 60 130 More tremor; numb elbow; dystonic hand  Monopolar review 5/6/7 (33% each)-C+ 3.9 60 130 More tremor; numb elbow; arm heavy  Monopolar review 8-C+ 3.9 60 130 Significant tremor

## 2024-01-04 NOTE — Patient Instructions (Signed)
Make an appt with your primary care to see if you still need your ramipril.  You may, as this medication is also used for kidney protection in diabetics.  If its still needed, we may need to add medication to lift UP your blood pressure as its getting pretty low.  I'm hoping though that the ramipril can be decreased.  Your provider has requested that you have labwork completed today. The lab is located on the Second floor at [address], within the [facility] office. When you get off the elevator, turn right and go in the [facility] [address]; the first brown door on the left.  Tell the ladies behind the desk that you are there for lab work. If you are not called within 15 minutes please check with the front desk.   Once you complete your labs you are free to go. You will receive a call or message via MyChart with your lab results.

## 2024-01-05 ENCOUNTER — Encounter: Payer: Self-pay | Admitting: Neurology

## 2024-01-05 LAB — CBC
HCT: 44 % (ref 38.5–50.0)
Hemoglobin: 14.1 g/dL (ref 13.2–17.1)
MCH: 28.2 pg (ref 27.0–33.0)
MCHC: 32 g/dL (ref 32.0–36.0)
MCV: 88 fL (ref 80.0–100.0)
MPV: 9.1 fL (ref 7.5–12.5)
Platelets: 282 10*3/uL (ref 140–400)
RBC: 5 10*6/uL (ref 4.20–5.80)
RDW: 14.5 % (ref 11.0–15.0)
WBC: 6.1 10*3/uL (ref 3.8–10.8)

## 2024-01-05 LAB — URINALYSIS
Bilirubin Urine: NEGATIVE
Glucose, UA: NEGATIVE
Hgb urine dipstick: NEGATIVE
Leukocytes,Ua: NEGATIVE
Nitrite: NEGATIVE
Protein, ur: NEGATIVE
Specific Gravity, Urine: 1.02 (ref 1.001–1.035)
pH: 7 (ref 5.0–8.0)

## 2024-01-05 LAB — COMPREHENSIVE METABOLIC PANEL
AG Ratio: 1.4 (calc) (ref 1.0–2.5)
ALT: 8 U/L — ABNORMAL LOW (ref 9–46)
AST: 14 U/L (ref 10–35)
Albumin: 4 g/dL (ref 3.6–5.1)
Alkaline phosphatase (APISO): 41 U/L (ref 35–144)
BUN: 16 mg/dL (ref 7–25)
CO2: 30 mmol/L (ref 20–32)
Calcium: 9.5 mg/dL (ref 8.6–10.3)
Chloride: 101 mmol/L (ref 98–110)
Creat: 0.85 mg/dL (ref 0.70–1.28)
Globulin: 2.9 g/dL (ref 1.9–3.7)
Glucose, Bld: 77 mg/dL (ref 65–99)
Potassium: 4.8 mmol/L (ref 3.5–5.3)
Sodium: 138 mmol/L (ref 135–146)
Total Bilirubin: 0.8 mg/dL (ref 0.2–1.2)
Total Protein: 6.9 g/dL (ref 6.1–8.1)

## 2024-03-21 ENCOUNTER — Other Ambulatory Visit: Payer: Self-pay | Admitting: Neurology

## 2024-03-21 DIAGNOSIS — G20A1 Parkinson's disease without dyskinesia, without mention of fluctuations: Secondary | ICD-10-CM

## 2024-06-03 NOTE — Progress Notes (Deleted)
 Assessment/Plan:   1.  Parkinsons Disease  - continue carbidopa /levodopa  25/100, 2 at 9am/2 at 1 pm/2 at 5pm.    -Continue carbidopa /levodopa  50/200 cr at bed for cramping q hs  -Status post right STN DBS with [employer] device on December 29, 2019 with IPG January 05, 2020.  DBS just slightly adjusted today because of left foot dyskinesia.  He does have right hand tremor, mild, but he has no DBS on that side to adjust to accommodate for that.  -Labs today including urinalysis, CBC, chemistry, given increased numbers of falls over the last week (5 over the last week, but none prior to that). 2.  Insomnia  -On trazodone  3.  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  Does not want to do neurocognitive testing.  -Continue donepezil , 10 mg daily 4.  Diabetes mellitus  -Following with endocrinology.   5.  Left ptosis  -Acetylcholine receptor antibodies were negative as was MRI of the brain. 6.  Overactive bladder  -[facility]  -on vesicare 7.  Low BP  - Primary care has been decreasing his ramipril   Subjective:   Bill Bowman was seen today in follow up for Parkinsons disease.  My previous records were reviewed prior to todays visit as well as outside records available to me.  Patient is with his wife who supplements the history.  Patient remains on levodopa , donepezil , trazodone .  Last visit, his blood pressure was pretty low and he was going to talk to his primary care about that.  I was able to look at his primary care records from about 3 weeks after I saw him and they did decrease his ramipril  to 5 mg daily.  Current prescribed movement disorder medications: Carbidopa /levodopa  25/100, 2 tablets 3 times per day  Carbidopa /levodopa  50/200 at bedtime  Donepezil , 10 mg daily Trazodone   Prior meds: pramipexole  (?compulsive behavior)   ALLERGIES:   Allergies  Allergen Reactions   Clarithromycin Hypertension   Hydrocodone Bit-Homatrop  Mbr Nausea And Vomiting   Liraglutide Nausea And Vomiting   Homatropine    Hydrocodone    Morphine     CURRENT MEDICATIONS:  Outpatient Encounter Medications as of 06/06/2024  Medication Sig   aspirin  81 MG chewable tablet  (Patient not taking: Reported on 01/04/2024)   aspirin  EC 81 MG tablet Take 81 mg by mouth daily.   carbidopa -levodopa  (SINEMET  CR) 50-200 MG tablet TAKE 1 TABLET BY MOUTH AT BEDTIME   carbidopa -levodopa  (SINEMET  IR) 25-100 MG tablet TAKE 2 TABLETS BY MOUTH THREE TIMES DAILY   donepezil  (ARICEPT ) 10 MG tablet Take 1 tablet (10 mg total) by mouth at bedtime.   donepezil  (ARICEPT ) 5 MG tablet Take 1 tablet (5 mg total) by mouth at bedtime.   Exenatide  ER 2 MG/0.85ML AUIJ Inject 2 mg into the skin once a week.    gabapentin (NEURONTIN) 100 MG capsule Take 1 capsule by mouth three times daily as needed   Insulin  Glargine 300 UNIT/ML SOPN Inject 52 Units into the skin at bedtime.    metFORMIN  (GLUCOPHAGE ) 500 MG tablet Take 1,000 mg by mouth 2 (two) times daily.   pantoprazole  (PROTONIX ) 40 MG tablet Take 40 mg by mouth daily.   PARoxetine  (PAXIL ) 30 MG tablet Take 30 mg by mouth daily.   ramipril  (ALTACE ) 10 MG capsule Take 10 mg by mouth daily.   traMADol (ULTRAM) 50 MG tablet Take 1 tablet by mouth as needed.   traZODone  (DESYREL ) 50 MG tablet Take  50 mg by mouth at bedtime.   No facility-administered encounter medications on file as of 06/06/2024.    Objective:   PHYSICAL EXAMINATION:    VITALS:   There were no vitals filed for this visit.  Wt Readings from Last 3 Encounters:  01/04/24 191 lb 12.8 oz (87 kg)  07/06/23 199 lb (90.3 kg)  12/30/22 208 lb 3.2 oz (94.4 kg)   GEN:  The patient appears stated age and is in NAD. HEENT:  Normocephalic, atraumatic.  The mucous membranes are moist. The superficial temporal arteries are without ropiness or tenderness. CV:  RRR Lungs:  CTAB Neck/HEME:  There are no carotid bruits bilaterally.  Neurological  examination:  Orientation: The patient is alert and oriented x3.  He has trouble with the finer aspects of his history and looks to wife for this.  This is similar to previous Cranial nerves: There is facial hypomimia.   There is L ptosis.  The speech is fluent and clear. Soft palate rises symmetrically and there is no tongue deviation. Hearing is intact to conversational tone. Sensation: Sensation is intact to light touch throughout Motor: Strength is at least antigravity x4.   Movement examination: Tone: There is normal tone in the upper and lower extremity. Abnormal movements: occ mild dyskinesia of the L foot prior to programming.  This seemed to be better post programming.  Rare tremor on the R Coordination:  There is mild decremation with RAM's. Gait and Station: The patient pushes off of the chair to arise.  He ambulates fairly well with a walker.  He is forward flexed.  This is stable from prior.  I have reviewed and interpreted the following labs independently    Chemistry      Component Value Date/Time   NA 138 01/04/2024 1358   K 4.8 01/04/2024 1358   CL 101 01/04/2024 1358   CO2 30 01/04/2024 1358   BUN 16 01/04/2024 1358   CREATININE 0.85 01/04/2024 1358      Component Value Date/Time   CALCIUM 9.5 01/04/2024 1358   ALKPHOS 43 12/29/2019 0625   AST 14 01/04/2024 1358   ALT 8 (L) 01/04/2024 1358   BILITOT 0.8 01/04/2024 1358       Lab Results  Component Value Date   WBC 6.1 01/04/2024   HGB 14.1 01/04/2024   HCT 44.0 01/04/2024   MCV 88.0 01/04/2024   PLT 282 01/04/2024    No results found for: TSH   Total time spent on today's visit was *** minutes, including both face-to-face time and nonface-to-face time.  Time included that spent on review of records (prior notes available to me/labs/imaging if pertinent), discussing treatment and goals, answering patient's questions and coordinating care.  This did not include DBS time.  Cc:  Wendel Standing, MD

## 2024-06-06 ENCOUNTER — Encounter: Payer: Medicare Other | Admitting: Neurology

## 2024-06-20 ENCOUNTER — Encounter: Admitting: Neurology

## 2024-06-23 ENCOUNTER — Other Ambulatory Visit: Payer: Self-pay | Admitting: Neurology

## 2024-06-23 DIAGNOSIS — G20A1 Parkinson's disease without dyskinesia, without mention of fluctuations: Secondary | ICD-10-CM

## 2024-07-03 ENCOUNTER — Other Ambulatory Visit: Payer: Self-pay | Admitting: Neurology

## 2024-07-03 DIAGNOSIS — G20A2 Parkinson's disease without dyskinesia, with fluctuations: Secondary | ICD-10-CM

## 2024-07-11 ENCOUNTER — Telehealth: Payer: Self-pay | Admitting: Neurology

## 2024-07-11 NOTE — Telephone Encounter (Signed)
 Pt. Wife missed call calling back

## 2024-07-14 NOTE — Progress Notes (Signed)
 Assessment/Plan:   1.  Parkinsons Disease             - continue carbidopa /levodopa  25/100, 2 at 9am/2 at 1 pm/2 at 5pm.               -Continue carbidopa /levodopa  50/200 cr at bed for cramping q hs             -Status post right STN DBS with [employer] device on December 29, 2019 with IPG January 05, 2020.    -wife stated daughter was hoping to get a wheelchair, but she did not want him to have one, as she thinks he is pretty stable with cane/walker.  I told them that if he was going a long distance, then a wheelchair might be a safe option (airport, crowded venue, etc.) and I gave him the information to the dancing goat dme in [place], [place].  2.  Insomnia             -On trazodone  3.  Memory change             -May have an early neurodegenerative process now.  Does not want to do neurocognitive testing.             -Continue donepezil , 10 mg daily  4.  Diabetes mellitus             -Following with endocrinology.   5.  Left ptosis             -Acetylcholine receptor antibodies were negative as was MRI of the brain. 6.  Overactive bladder             -[facility]             -on vesicare 7.  Low BP             - Primary care has been decreasing his ramipril  8.  Spells of alteration of awareness  -all have occurred while seated so unlikely Neurogenic Orthostatic Hypotension and unlikely a primary neuro event  -do EEG.  Patient wants done at [facility].  - Do MRI brain.  Patient wants done at [facility].  I will check to make sure that they do these with deep brain stimulators.  -f/u pcp and cardiology  Subjective:   Bill Bowman was seen today in follow up for Parkinsons disease.  My previous records were reviewed prior to todays visit as well as outside records available to me.  Patient is with his wife who supplements the history.  Patient remains on levodopa , donepezil , trazodone .  Last visit, his blood pressure was pretty low and he was going to talk to his primary care  about that.  I was able to look at his primary care records from about 3 weeks after I saw him and they did decrease his ramipril  to 5 mg daily.  He reports that he has had vertigo lately.  He has some trouble describing the sx but does state that the room was spinning.   He has had some falls but never outside of the house.  He fell 2 nights ago in front of the recliner.  He has trouble describing the nature of the fall.  He is not exercising.  No hallucinations.  Wife describes spells; has had 3 of them.  She states that they occur when seated.  He seems to go limp and his face was nearly on his plate.  She called name a few times.  He states  that he doesn't remember it but he was able to assist her and shuffle along to the recliner but he couldn't sit on the recliner.  He will feel zapped after per wife.  No loss of bladder/bowel control.  All 3 events were the same and they all feel like he is in a daze.     Current prescribed movement disorder medications: Carbidopa /levodopa  25/100, 2 tablets 3 times per day   Current prescribed movement disorder medications: Carbidopa /levodopa  25/100, 2 tablets 3 times per day  Carbidopa /levodopa  50/200 at bedtime  Donepezil , 10 mg daily Trazodone   Prior meds: pramipexole  (?compulsive behavior)   ALLERGIES:   Allergies  Allergen Reactions   Clarithromycin Hypertension   Hydrocodone Bit-Homatrop Mbr Nausea And Vomiting   Liraglutide Nausea And Vomiting   Homatropine    Hydrocodone    Morphine     CURRENT MEDICATIONS:  Outpatient Encounter Medications as of 07/18/2024  Medication Sig   aspirin  81 MG chewable tablet  (Patient not taking: Reported on 01/04/2024)   aspirin  EC 81 MG tablet Take 81 mg by mouth daily.   carbidopa -levodopa  (SINEMET  CR) 50-200 MG tablet TAKE 1 TABLET BY MOUTH AT BEDTIME   carbidopa -levodopa  (SINEMET  IR) 25-100 MG tablet TAKE 2 TABLETS BY MOUTH THREE TIMES DAILY   donepezil  (ARICEPT ) 10 MG tablet Take 1 tablet (10  mg total) by mouth at bedtime.   donepezil  (ARICEPT ) 5 MG tablet Take 1 tablet (5 mg total) by mouth at bedtime.   Exenatide  ER 2 MG/0.85ML AUIJ Inject 2 mg into the skin once a week.    gabapentin (NEURONTIN) 100 MG capsule Take 1 capsule by mouth three times daily as needed   Insulin  Glargine 300 UNIT/ML SOPN Inject 52 Units into the skin at bedtime.    metFORMIN  (GLUCOPHAGE ) 500 MG tablet Take 1,000 mg by mouth 2 (two) times daily.   pantoprazole  (PROTONIX ) 40 MG tablet Take 40 mg by mouth daily.   PARoxetine  (PAXIL ) 30 MG tablet Take 30 mg by mouth daily.   ramipril  (ALTACE ) 10 MG capsule Take 10 mg by mouth daily.   traMADol (ULTRAM) 50 MG tablet Take 1 tablet by mouth as needed.   traZODone  (DESYREL ) 50 MG tablet Take 50 mg by mouth at bedtime.   No facility-administered encounter medications on file as of 07/18/2024.    Objective:   PHYSICAL EXAMINATION:    VITALS:   Vitals:   07/18/24 1415  BP: 124/80  Pulse: 67  SpO2: 98%  Weight: 197 lb 6.4 oz (89.5 kg)    Wt Readings from Last 3 Encounters:  07/18/24 197 lb 6.4 oz (89.5 kg)  01/04/24 191 lb 12.8 oz (87 kg)  07/06/23 199 lb (90.3 kg)   GEN:  The patient appears stated age and is in NAD. HEENT:  Normocephalic, atraumatic.  The mucous membranes are moist. The superficial temporal arteries are without ropiness or tenderness. CV:  RRR Lungs:  CTAB Neck/HEME:  There are no carotid bruits bilaterally.  Neurological examination:  Orientation: The patient is alert and oriented x3.  He has trouble with the finer aspects of his history and looks to wife for this.  This is similar to last several visits Cranial nerves: There is facial hypomimia.   There is L ptosis.  The speech is fluent and clear. Soft palate rises symmetrically and there is no tongue deviation. Hearing is intact to conversational tone. Sensation: Sensation is intact to light touch throughout Motor: Strength is at least antigravity x4.   Movement  examination: Tone: There is mild increased tone in the RUE Abnormal movements: no dyskinesia or tremor today Coordination:  There is mild decremation with RAM's. Gait and Station: The patient pushes off of the chair to arise.  He ambulates fairly well with a walker.  He is forward flexed.  This is stable from prior.  I have reviewed and interpreted the following labs independently    Chemistry      Component Value Date/Time   NA 138 01/04/2024 1358   K 4.8 01/04/2024 1358   CL 101 01/04/2024 1358   CO2 30 01/04/2024 1358   BUN 16 01/04/2024 1358   CREATININE 0.85 01/04/2024 1358      Component Value Date/Time   CALCIUM 9.5 01/04/2024 1358   ALKPHOS 43 12/29/2019 0625   AST 14 01/04/2024 1358   ALT 8 (L) 01/04/2024 1358   BILITOT 0.8 01/04/2024 1358       Lab Results  Component Value Date   WBC 6.1 01/04/2024   HGB 14.1 01/04/2024   HCT 44.0 01/04/2024   MCV 88.0 01/04/2024   PLT 282 01/04/2024    No results found for: TSH   Total time spent on today's visit was 44 minutes, including both face-to-face time and nonface-to-face time.  Time included that spent on review of records (prior notes available to me/labs/imaging if pertinent), discussing treatment and goals, answering patient's questions and coordinating care.  This did not include DBS time.  Cc:  Wendel Standing, MD

## 2024-07-18 ENCOUNTER — Ambulatory Visit (INDEPENDENT_AMBULATORY_CARE_PROVIDER_SITE_OTHER): Admitting: Neurology

## 2024-07-18 ENCOUNTER — Encounter: Payer: Self-pay | Admitting: Neurology

## 2024-07-18 VITALS — BP 124/80 | HR 67 | Wt 197.4 lb

## 2024-07-18 DIAGNOSIS — R404 Transient alteration of awareness: Secondary | ICD-10-CM

## 2024-07-18 DIAGNOSIS — G20A1 Parkinson's disease without dyskinesia, without mention of fluctuations: Secondary | ICD-10-CM | POA: Diagnosis not present

## 2024-07-18 DIAGNOSIS — Z9689 Presence of other specified functional implants: Secondary | ICD-10-CM

## 2024-07-18 NOTE — Procedures (Signed)
 DBS Programming was performed.    Manufacturer of DBS device: [employer] without bluetooth  Total time spent programming was 8 minutes.  Device was confirmed to be on.  Soft start was confirmed to be on.  Impedences were checked and were within normal limits.  Battery was checked and was determined to be functioning normally and not near the end of life.  Final settings were as follows:    Active Contact Amplitude (mA) PW (ms) Frequency (hz) Side Effects  Left Brain       n/a                            Right Brain       07/09/20 1-(30%)2/3/4 (23% each)- C+ 3.5 60 130   07/30/20 1-(10%)2/3/4(30% each)C+ 3.6 60 130   12/06/20 2/3/4(33%each)-C+ 3.8 (3.1-4.1) 60 130   06/07/21 2/3/4(33%each)-C+ 3.8 60 130   12/16/21 2/3/4(33%each)-C+ 3.8 60 130   06/23/22 2/3/4(33%each)-C+ 3.8 60 130   12/30/22 2a/b/c (33%each)C+ 3.8 60 130   07/06/23 2a/b/c (33%each)C+ 3.9 60 139   01/04/24 2a/b/c (33%each)C+ 3.9 60 132   07/18/24 2a/b/c (33%each)C+ 3.9 60 136            Prior settings: 01/30/20 1-(80%)2-(8%)3-(6%)4-(6%) 3.7 60 130    01/23/20 1-(70%)2/3/4(10% each)-C+ 3.7 60 130    01/23/20 monopolar review 2/3/4(33% each)-C+ 4.0 60 130 More tremor; numb elbow; dystonic hand  Monopolar review 5/6/7 (33% each)-C+ 3.9 60 130 More tremor; numb elbow; arm heavy  Monopolar review 8-C+ 3.9 60 130 Significant tremor

## 2024-07-18 NOTE — Patient Instructions (Signed)
We discussed a nonprofit medical company called [employer].  They have all kinds of medical equipment including wheelchairs, walkers, canes and incontinence materials, which is free of charge.  Their phone number is [phone].  Their email is [email or web address].  Feel free to reach out to them with questions.  I believe that they are only open for pick up on Tuesdays but you can contact them other days of the week.

## 2024-07-24 ENCOUNTER — Other Ambulatory Visit: Payer: Self-pay | Admitting: Neurology

## 2024-07-24 DIAGNOSIS — G20A1 Parkinson's disease without dyskinesia, without mention of fluctuations: Secondary | ICD-10-CM

## 2024-08-02 ENCOUNTER — Telehealth: Payer: Self-pay | Admitting: Neurology

## 2024-08-02 NOTE — Telephone Encounter (Signed)
 Pt wife wants to check the status of the the 2 different scans that the patient was to have. They have not heard from anyone about getting these schedule

## 2024-08-02 NOTE — Telephone Encounter (Signed)
 Patient wife advised MRI was sent to [facility]. [facility] phone number given.

## 2024-08-09 ENCOUNTER — Telehealth: Payer: Self-pay | Admitting: Neurology

## 2024-08-09 ENCOUNTER — Other Ambulatory Visit: Payer: Self-pay

## 2024-08-09 DIAGNOSIS — R296 Repeated falls: Secondary | ICD-10-CM

## 2024-08-09 DIAGNOSIS — R410 Disorientation, unspecified: Secondary | ICD-10-CM

## 2024-08-09 DIAGNOSIS — R404 Transient alteration of awareness: Secondary | ICD-10-CM

## 2024-08-09 NOTE — Telephone Encounter (Signed)
 Called and spoke to patients wife Adrien and provided her with [facility] phone number at [phone] ext [phone]

## 2024-08-09 NOTE — Telephone Encounter (Signed)
 Pt. Would like phone number to MRI order sent 3 weeks ago,  [phone] does not work

## 2024-08-09 NOTE — Telephone Encounter (Signed)
 Spoke to patients wife about misunderstanding regarding the MRI for DBS . Patients wife would like to come to [place] and will go to [facility] for MRI

## 2024-08-09 NOTE — Telephone Encounter (Signed)
 The wife called very aggravated. She said she has called their family physician and will start there to get a referral. She called every number we gave her and they're stating they have not received a referral. She said she shouldn't have to call and see if they have got a referral. She said just let them know we dont need it. She will go to their PCP and get what is needed

## 2024-08-19 ENCOUNTER — Encounter (HOSPITAL_COMMUNITY): Payer: Self-pay

## 2024-08-19 ENCOUNTER — Ambulatory Visit (HOSPITAL_COMMUNITY)
Admission: RE | Admit: 2024-08-19 | Discharge: 2024-08-19 | Disposition: A | Discharge status: Home / Self Care | Source: Ambulatory Visit | Attending: Neurology | Admitting: Neurology

## 2024-08-19 DIAGNOSIS — R296 Repeated falls: Secondary | ICD-10-CM

## 2024-08-19 DIAGNOSIS — R404 Transient alteration of awareness: Secondary | ICD-10-CM

## 2024-08-19 DIAGNOSIS — R410 Disorientation, unspecified: Secondary | ICD-10-CM

## 2024-08-20 ENCOUNTER — Other Ambulatory Visit: Payer: Self-pay | Admitting: Neurology

## 2024-08-20 DIAGNOSIS — G20A1 Parkinson's disease without dyskinesia, without mention of fluctuations: Secondary | ICD-10-CM

## 2024-08-23 ENCOUNTER — Encounter: Payer: Self-pay | Admitting: Neurology

## 2024-08-26 ENCOUNTER — Other Ambulatory Visit: Payer: Self-pay | Admitting: Neurology

## 2024-08-26 DIAGNOSIS — G20A1 Parkinson's disease without dyskinesia, without mention of fluctuations: Secondary | ICD-10-CM

## 2024-08-31 ENCOUNTER — Encounter (HOSPITAL_COMMUNITY): Payer: Self-pay

## 2024-08-31 ENCOUNTER — Ambulatory Visit (HOSPITAL_COMMUNITY): Admission: RE | Admit: 2024-08-31 | Source: Ambulatory Visit

## 2024-09-05 ENCOUNTER — Telehealth: Payer: Self-pay | Admitting: Neurology

## 2024-09-05 NOTE — Telephone Encounter (Signed)
 Pt's wife wants to discuss MRI

## 2024-09-05 NOTE — Telephone Encounter (Signed)
 The hospital is asking for something for anxiety medication to help patient go through the MRI process

## 2024-09-06 MED ORDER — DIAZEPAM 5 MG PO TABS: ORAL_TABLET | ORAL | 0 refills | Status: AC

## 2024-09-06 NOTE — Telephone Encounter (Signed)
 The medication was sent to his pharmacy.  He will need a driver

## 2024-09-29 ENCOUNTER — Ambulatory Visit (HOSPITAL_COMMUNITY)
Admission: RE | Admit: 2024-09-29 | Discharge: 2024-09-29 | Disposition: A | Discharge status: Home / Self Care | Source: Ambulatory Visit | Attending: Neurology | Admitting: Neurology

## 2024-09-29 DIAGNOSIS — R296 Repeated falls: Secondary | ICD-10-CM | POA: Insufficient documentation

## 2024-09-29 DIAGNOSIS — R410 Disorientation, unspecified: Secondary | ICD-10-CM | POA: Insufficient documentation

## 2024-09-29 DIAGNOSIS — R404 Transient alteration of awareness: Secondary | ICD-10-CM | POA: Diagnosis present

## 2024-10-03 ENCOUNTER — Ambulatory Visit: Payer: Self-pay | Admitting: Neurology

## 2024-10-03 ENCOUNTER — Other Ambulatory Visit: Payer: Self-pay

## 2024-10-03 DIAGNOSIS — R404 Transient alteration of awareness: Secondary | ICD-10-CM

## 2024-10-03 DIAGNOSIS — R410 Disorientation, unspecified: Secondary | ICD-10-CM

## 2024-10-03 NOTE — Telephone Encounter (Signed)
 do EEG.  Patient wants done at [facility].

## 2024-10-06 ENCOUNTER — Other Ambulatory Visit: Payer: Self-pay | Admitting: Neurology

## 2024-10-06 DIAGNOSIS — G20A2 Parkinson's disease without dyskinesia, with fluctuations: Secondary | ICD-10-CM

## 2024-10-10 ENCOUNTER — Other Ambulatory Visit: Payer: Self-pay | Admitting: Neurology

## 2024-10-10 DIAGNOSIS — G20A1 Parkinson's disease without dyskinesia, without mention of fluctuations: Secondary | ICD-10-CM

## 2025-01-05 NOTE — Progress Notes (Unsigned)
 "   Assessment/Plan:   1.  Parkinsons Disease             - continue carbidopa /levodopa  25/100, 2 at 9am/2 at 1 pm/2 at 5pm.               -Continue carbidopa /levodopa  50/200 cr at bed for cramping q hs             -Status post right STN DBS with [employer] device on December 29, 2019 with IPG January 05, 2020.    -wife stated daughter was hoping to get a wheelchair, but she did not want him to have one, as she thinks he is pretty stable with cane/walker.  I told them that if he was going a long distance, then a wheelchair might be a safe option (airport, crowded venue, etc.) and I gave him the information to the dancing goat dme in [place], [place].  2.  Insomnia             -On trazodone  3.  Memory change             -May have an early neurodegenerative process now.  Does not want to do neurocognitive testing.             -Continue donepezil , 10 mg daily  4.  Diabetes mellitus             -Following with endocrinology.   5.  Left ptosis             -Acetylcholine receptor antibodies were negative as was MRI of the brain. 6.  Overactive bladder             -[facility]             -on vesicare 7.  Low BP             - Primary care has been decreasing his ramipril  8.  Spells of alteration of awareness  -all have occurred while seated so unlikely Neurogenic Orthostatic Hypotension and unlikely a primary neuro event  -do EEG.  Patient wants done at [facility].  - Do MRI brain.  Patient wants done at [facility].  I will check to make sure that they do these with deep brain stimulators.  -f/u pcp and cardiology  Subjective:   Bill Bowman was seen today in follow up for Parkinsons disease.  My previous records were reviewed prior to todays visit as well as outside records available to me.  Patient is with his wife who supplements the history.   Last visit, he was describing spells of alteration of awareness and he had an EEG at [place].  It was normal.  MRI of the brain was  unremarkable, just demonstrating mild to moderate white matter disease.  I did tell him to f/u pcp and cardiology.  Patient was admitted to [facility] in early January after having a fall.  His blood sugar was 42 at the time.  During the ED stay, the physician talked to the daughter who noted that patient needed SNF placement because of progression of disease and wife's inability to care for him at home.  Plan at discharge from [facility] was skilled SNF long-term.  He was seen at endocrinology January 22.  They restarted his Mounjaro as he had been off his diabetes medication prior to that.   Current prescribed movement disorder medications: Carbidopa /levodopa  25/100, 2 tablets 3 times per day   Current prescribed movement disorder medications: Carbidopa /levodopa  25/100, 2 tablets 3 times  per day  Carbidopa /levodopa  50/200 at bedtime  Donepezil , 10 mg daily Trazodone   Prior meds: pramipexole  (?compulsive behavior)   ALLERGIES:   Allergies  Allergen Reactions   Clarithromycin Hypertension   Hydrocodone Bit-Homatrop Mbr Nausea And Vomiting   Liraglutide Nausea And Vomiting   Homatropine    Hydrocodone    Morphine     CURRENT MEDICATIONS:  Outpatient Encounter Medications as of 01/09/2025  Medication Sig   aspirin  81 MG chewable tablet  (Patient not taking: Reported on 01/04/2024)   aspirin  EC 81 MG tablet Take 81 mg by mouth daily.   carbidopa -levodopa  (SINEMET  CR) 50-200 MG tablet TAKE 1 TABLET BY MOUTH AT BEDTIME   carbidopa -levodopa  (SINEMET  IR) 25-100 MG tablet TAKE 2 TABLETS BY MOUTH THREE TIMES DAILY   diazepam  (VALIUM ) 5 MG tablet 1 tablet 1 hour prior to MRI; may repeat 30 min prior if needed   donepezil  (ARICEPT ) 10 MG tablet Take 1 tablet (10 mg total) by mouth at bedtime.   donepezil  (ARICEPT ) 5 MG tablet Take 1 tablet (5 mg total) by mouth at bedtime.   Exenatide  ER 2 MG/0.85ML AUIJ Inject 2 mg into the skin once a week.    gabapentin (NEURONTIN) 100 MG capsule Take 1 capsule by  mouth three times daily as needed   Insulin  Glargine 300 UNIT/ML SOPN Inject 52 Units into the skin at bedtime.    metFORMIN  (GLUCOPHAGE ) 500 MG tablet Take 1,000 mg by mouth 2 (two) times daily.   pantoprazole  (PROTONIX ) 40 MG tablet Take 40 mg by mouth daily.   PARoxetine  (PAXIL ) 30 MG tablet Take 30 mg by mouth daily.   ramipril  (ALTACE ) 10 MG capsule Take 10 mg by mouth daily.   traMADol (ULTRAM) 50 MG tablet Take 1 tablet by mouth as needed.   traZODone  (DESYREL ) 50 MG tablet Take 50 mg by mouth at bedtime.   No facility-administered encounter medications on file as of 01/09/2025.    Objective:   PHYSICAL EXAMINATION:    VITALS:   There were no vitals filed for this visit.   Wt Readings from Last 3 Encounters:  07/18/24 197 lb 6.4 oz (89.5 kg)  01/04/24 191 lb 12.8 oz (87 kg)  07/06/23 199 lb (90.3 kg)   GEN:  The patient appears stated age and is in NAD. HEENT:  Normocephalic, atraumatic.  The mucous membranes are moist. The superficial temporal arteries are without ropiness or tenderness. CV:  RRR Lungs:  CTAB Neck/HEME:  There are no carotid bruits bilaterally.  Neurological examination:  Orientation: The patient is alert and oriented x3.  He has trouble with the finer aspects of his history and looks to wife for this.  This is similar to last several visits Cranial nerves: There is facial hypomimia.   There is L ptosis.  The speech is fluent and clear. Soft palate rises symmetrically and there is no tongue deviation. Hearing is intact to conversational tone. Sensation: Sensation is intact to light touch throughout Motor: Strength is at least antigravity x4.   Movement examination: Tone: There is mild increased tone in the RUE Abnormal movements: no dyskinesia or tremor today Coordination:  There is mild decremation with RAM's. Gait and Station: The patient pushes off of the chair to arise.  He ambulates fairly well with a walker.  He is forward flexed.  This is  stable from prior.  I have reviewed and interpreted the following labs independently    Chemistry      Component Value Date/Time  NA 138 01/04/2024 1358   K 4.8 01/04/2024 1358   CL 101 01/04/2024 1358   CO2 30 01/04/2024 1358   BUN 16 01/04/2024 1358   CREATININE 0.85 01/04/2024 1358      Component Value Date/Time   CALCIUM 9.5 01/04/2024 1358   ALKPHOS 43 12/29/2019 0625   AST 14 01/04/2024 1358   ALT 8 (L) 01/04/2024 1358   BILITOT 0.8 01/04/2024 1358       Lab Results  Component Value Date   WBC 6.1 01/04/2024   HGB 14.1 01/04/2024   HCT 44.0 01/04/2024   MCV 88.0 01/04/2024   PLT 282 01/04/2024    No results found for: TSH   Total time spent on today's visit was *** minutes, including both face-to-face time and nonface-to-face time.  Time included that spent on review of records (prior notes available to me/labs/imaging if pertinent), discussing treatment and goals, answering patient's questions and coordinating care.  This did not include DBS time.  Cc:  Joneen Nest, MD "

## 2025-01-06 ENCOUNTER — Telehealth: Payer: Self-pay | Admitting: Neurology

## 2025-01-06 NOTE — Telephone Encounter (Signed)
 Pt wife called to let Dr.tat know that she had to place Nathanie in a facility in [place] but she would like to continue to see Tat. She will continue to bring him to his appts.

## 2025-01-06 NOTE — Progress Notes (Unsigned)
 "   Assessment/Plan:   1.  Parkinsons Disease             - continue carbidopa /levodopa  25/100, and changed timing of medication to 2 at 7am, 2 at 11am, 2 at 4pm             -Continue carbidopa /levodopa  50/200 cr at bed for cramping q hs             -Status post right STN DBS with [employer] device on December 29, 2019 with IPG January 05, 2020.    -discussed adding tennis balls to walker  2.  Insomnia             -On trazodone  3.  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  Does not want to do neurocognitive testing.             -Continue donepezil , 10 mg daily  4.  Diabetes mellitus             -Following with endocrinology.   5.  Left ptosis             -Acetylcholine receptor antibodies were negative as was MRI of the brain. 6.  Overactive bladder             -[facility]             -on vesicare 7.  Low BP             - Primary care has been decreasing his ramipril  8.  Spells of alteration of awareness  -all have occurred while seated so unlikely Neurogenic Orthostatic Hypotension and unlikely a primary neuro event  - EEG and MRI brain have been negative.  I think he does need a full cardiac workup. Subjective:   Bill Bowman was seen today in follow up for Parkinsons disease.  My previous records were reviewed prior to todays visit as well as outside records available to me.  Patient is with his wife who supplements the history.   Last visit, he was describing spells of alteration of awareness and he had an EEG at [place].  It was normal.  MRI of the brain was unremarkable, just demonstrating mild to moderate white matter disease.  I did tell him to f/u pcp and cardiology.  Patient was admitted to [facility] in early January after having a fall.  His blood sugar was 42 at the time.  During the ED stay, the physician talked to the daughter who noted that patient needed SNF placement because of progression of disease and  wife's inability to care for him at home.  Plan at discharge from [facility] was skilled SNF long-term.  He was seen at endocrinology January 22.  They restarted his Mounjaro as he had been off his diabetes medication prior to that.  SNF did not send his med list today but daughter is in management there so wife called her during the visit and she notes that no times are written down (so presumably the carbidopa /levodopa  is given 8 hours apart).  Clemens one time with the walker at the snf and they did xray and no fx.  He has some hallucinations when he awakens at the night but not in days.  Wife describes some confusion in late evening.  Patient notes today I am 74 years old and I have no control over my life.  Wife does note that  his daughter is in the facility (working) all day and does visit him when she can and then his wife comes into the facility at night to visit him or take him out to eat.  His friends have visited him as well.    Current prescribed movement disorder medications: Carbidopa /levodopa  25/100, 2 tablets 3 times per day  Carbidopa /levodopa  50/200 at bedtime  Donepezil , 10 mg daily Trazodone   Prior meds: pramipexole  (?compulsive behavior)   ALLERGIES:   Allergies  Allergen Reactions   Clarithromycin Hypertension   Hydrocodone Bit-Homatrop Mbr Nausea And Vomiting   Liraglutide Nausea And Vomiting   Homatropine    Hydrocodone    Morphine     CURRENT MEDICATIONS:  Outpatient Encounter Medications as of 01/10/2025  Medication Sig   aspirin  EC 81 MG tablet Take 81 mg by mouth daily.   carbidopa -levodopa  (SINEMET  CR) 50-200 MG tablet TAKE 1 TABLET BY MOUTH AT BEDTIME   carbidopa -levodopa  (SINEMET  IR) 25-100 MG tablet TAKE 2 TABLETS BY MOUTH THREE TIMES DAILY   diazepam  (VALIUM ) 5 MG tablet 1 tablet 1 hour prior to MRI; may repeat 30 min prior if needed   donepezil  (ARICEPT ) 10 MG tablet Take 1 tablet (10 mg total) by mouth at bedtime.   donepezil  (ARICEPT ) 5 MG tablet Take 1  tablet (5 mg total) by mouth at bedtime.   Exenatide  ER 2 MG/0.85ML AUIJ Inject 2 mg into the skin once a week.    gabapentin (NEURONTIN) 100 MG capsule Take 1 capsule by mouth three times daily as needed   MOUNJARO 2.5 MG/0.5ML Pen Inject 2.5 mg into the skin once a week.   pantoprazole  (PROTONIX ) 40 MG tablet Take 40 mg by mouth daily.   PARoxetine  (PAXIL ) 30 MG tablet Take 30 mg by mouth daily.   ramipril  (ALTACE ) 10 MG capsule Take 10 mg by mouth daily.   traMADol (ULTRAM) 50 MG tablet Take 1 tablet by mouth as needed.   traZODone  (DESYREL ) 50 MG tablet Take 50 mg by mouth at bedtime.   [DISCONTINUED] aspirin  81 MG chewable tablet  (Patient not taking: Reported on 01/04/2024)   [DISCONTINUED] Insulin  Glargine 300 UNIT/ML SOPN Inject 52 Units into the skin at bedtime.    [DISCONTINUED] metFORMIN  (GLUCOPHAGE ) 500 MG tablet Take 1,000 mg by mouth 2 (two) times daily.   No facility-administered encounter medications on file as of 01/10/2025.    Objective:   PHYSICAL EXAMINATION:    VITALS:   Vitals:   01/10/25 1301  BP: 124/76  Pulse: 71  SpO2: 98%  Weight: 186 lb 3.2 oz (84.5 kg)     Wt Readings from Last 3 Encounters:  01/10/25 186 lb 3.2 oz (84.5 kg)  07/18/24 197 lb 6.4 oz (89.5 kg)  01/04/24 191 lb 12.8 oz (87 kg)   GEN:  The patient appears stated age and is in NAD. HEENT:  Normocephalic, atraumatic.  The mucous membranes are moist. The superficial temporal arteries are without ropiness or tenderness. CV:  RRR Lungs:  CTAB Neck/HEME:  There are no carotid bruits bilaterally.  Neurological examination:  Orientation: The patient is alert and oriented x3.  He has trouble with the finer aspects of his history and looks to wife for this.  This is similar to last several visits Cranial nerves: There is facial hypomimia.   There is L ptosis.  The speech is fluent and clear. Soft palate rises symmetrically and there is no tongue deviation. Hearing is intact to conversational  tone. Sensation: Sensation is intact to  light touch throughout Motor: Strength is at least antigravity x4.   Movement examination: Tone: There is mild increased tone in the RUE Abnormal movements: there is rest tremor on the R hand (unclear when he got his last dose of levodopa  and may have been 6 am and seen at 1:45 pm) Coordination:  There is mild decremation with RAM's. Gait and Station: The patient pushes off of the chair to arise.  He is unstable without the walker  I have reviewed and interpreted the following labs independently    Chemistry      Component Value Date/Time   NA 138 01/04/2024 1358   K 4.8 01/04/2024 1358   CL 101 01/04/2024 1358   CO2 30 01/04/2024 1358   BUN 16 01/04/2024 1358   CREATININE 0.85 01/04/2024 1358      Component Value Date/Time   CALCIUM 9.5 01/04/2024 1358   ALKPHOS 43 12/29/2019 0625   AST 14 01/04/2024 1358   ALT 8 (L) 01/04/2024 1358   BILITOT 0.8 01/04/2024 1358       Lab Results  Component Value Date   WBC 6.1 01/04/2024   HGB 14.1 01/04/2024   HCT 44.0 01/04/2024   MCV 88.0 01/04/2024   PLT 282 01/04/2024    No results found for: TSH   Total time spent on today's visit was 41 minutes, including both face-to-face time and nonface-to-face time.  Time included that spent on review of records (prior notes available to me/labs/imaging if pertinent), discussing treatment and goals, answering patient's questions and coordinating care.  This did not include DBS time.  Cc:  Joneen Nest, MD "

## 2025-01-09 ENCOUNTER — Encounter: Admitting: Neurology

## 2025-01-10 ENCOUNTER — Encounter: Payer: Self-pay | Admitting: Neurology

## 2025-01-10 ENCOUNTER — Ambulatory Visit: Payer: Self-pay | Admitting: Neurology

## 2025-01-10 VITALS — BP 124/76 | HR 71 | Wt 186.2 lb

## 2025-01-10 DIAGNOSIS — G20A1 Parkinson's disease without dyskinesia, without mention of fluctuations: Secondary | ICD-10-CM | POA: Diagnosis not present

## 2025-01-10 DIAGNOSIS — G20A2 Parkinson's disease without dyskinesia, with fluctuations: Secondary | ICD-10-CM | POA: Diagnosis not present

## 2025-01-10 DIAGNOSIS — Z9689 Presence of other specified functional implants: Secondary | ICD-10-CM | POA: Diagnosis not present

## 2025-01-10 MED ORDER — CARBIDOPA-LEVODOPA 25-100 MG PO TABS: 2.0000 | ORAL_TABLET | Freq: Three times a day (TID) | ORAL | 1 refills | Status: AC

## 2025-01-10 MED ORDER — CARBIDOPA-LEVODOPA ER 50-200 MG PO TBCR: 1.0000 | EXTENDED_RELEASE_TABLET | Freq: Every day | ORAL | 1 refills | Status: AC

## 2025-01-10 NOTE — Procedures (Signed)
 DBS Programming was performed.    Manufacturer of DBS device: [employer] without bluetooth  Total time spent programming was 12 minutes.  Device was confirmed to be on.  Soft start was confirmed to be on.  Impedences were checked and were within normal limits.  Battery was checked and was determined to be functioning normally and not near the end of life.  Final settings were as follows:

## 2025-01-19 ENCOUNTER — Encounter: Admitting: Neurology

## 2025-07-10 ENCOUNTER — Encounter: Payer: Self-pay | Admitting: Neurology
